# Patient Record
Sex: Female | Born: 1985 | Race: White | Hispanic: No | Marital: Single | Smoking: Former smoker
Health system: Southern US, Community
[De-identification: ages and names within clinical notes are randomized; demographics above are authoritative.]

## PROBLEM LIST (undated history)

## (undated) DIAGNOSIS — J45909 Unspecified asthma, uncomplicated: Secondary | ICD-10-CM

## (undated) DIAGNOSIS — N3010 Interstitial cystitis (chronic) without hematuria: Secondary | ICD-10-CM

## (undated) DIAGNOSIS — G40909 Epilepsy, unspecified, not intractable, without status epilepticus: Secondary | ICD-10-CM

## (undated) HISTORY — PX: EYE SURGERY: SHX253

## (undated) HISTORY — PX: CYSTO: SHX6284

## (undated) HISTORY — PX: HERNIA REPAIR: SHX51

## (undated) HISTORY — PX: IMPLANTATION / PLACEMENT EPIDURAL NEUROSTIMULATOR ELECTRODES: SUR687

---
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
## 2004-03-03 ENCOUNTER — Emergency Department (HOSPITAL_COMMUNITY): Admission: EM | Admit: 2004-03-03 | Discharge: 2004-03-03 | Payer: Self-pay | Admitting: Emergency Medicine

## 2004-07-20 ENCOUNTER — Encounter: Admission: RE | Admit: 2004-07-20 | Discharge: 2004-07-20 | Payer: Self-pay | Admitting: Internal Medicine

## 2004-08-06 ENCOUNTER — Other Ambulatory Visit: Admission: RE | Admit: 2004-08-06 | Discharge: 2004-08-06 | Payer: Self-pay | Admitting: Obstetrics and Gynecology

## 2005-02-26 ENCOUNTER — Emergency Department (HOSPITAL_COMMUNITY): Admission: EM | Admit: 2005-02-26 | Discharge: 2005-02-26 | Payer: Self-pay | Admitting: Emergency Medicine

## 2005-04-24 ENCOUNTER — Emergency Department (HOSPITAL_COMMUNITY): Admission: EM | Admit: 2005-04-24 | Discharge: 2005-04-24 | Payer: Self-pay | Admitting: Emergency Medicine

## 2006-08-02 ENCOUNTER — Emergency Department (HOSPITAL_COMMUNITY): Admission: EM | Admit: 2006-08-02 | Discharge: 2006-08-03 | Payer: Self-pay | Admitting: Emergency Medicine

## 2007-06-02 ENCOUNTER — Encounter: Admission: RE | Admit: 2007-06-02 | Discharge: 2007-07-25 | Payer: Self-pay | Admitting: *Deleted

## 2008-04-06 ENCOUNTER — Emergency Department (HOSPITAL_COMMUNITY): Admission: EM | Admit: 2008-04-06 | Discharge: 2008-04-06 | Payer: Self-pay | Admitting: Emergency Medicine

## 2010-09-11 NOTE — Op Note (Signed)
NAME:  Melanie Dalton, Melanie Dalton                 ACCOUNT NO.:  [id number]   MEDICAL RECORD NO.:  [id number]                   PATIENT TYPE:  AMB   LOCATION:  [facility]                                 FACILITY:  [facility]   PHYSICIAN:  Jamison Neighbor, M.D.               DATE OF BIRTH:  [birth year]   DATE OF PROCEDURE:  04/02/2003  DATE OF DISCHARGE:                                 OPERATIVE REPORT   PREOPERATIVE DIAGNOSIS:  Urgency incontinence.   POSTOPERATIVE DIAGNOSIS:  Urgency incontinence.   OPERATION PERFORMED:  First stage instrument implantation.   SURGEON:  Jamison Neighbor, M.D.   ANESTHESIA:  Intravenous sedation.   COMPLICATIONS:  None.   DRAINS:  Sixteen French Foley catheter removed in the recovery room.   HISTORY:  This 25 year old female has uncontrolled urgency, frequency and  urgency incontinence.  She has not responded to any form of oral therapy and  has failed all forms of anticholinergic treatments.  The patient is to  undergo InterStim testing.  She is aware of the fact this is the first stage  of a planned two-stage approach and that the decision as to whether to  proceed will be based on whether she has a significant decrease in her  urgency, frequency and urge incontinence.  The patient is aware of the fact  this is not being done for pain control, but if she does have improvement in  her pelvic pain that would be considered a nice side effect.  The patient  gave full and informed consent.   DESCRIPTION OF OPERATION:  After successful induction of adequate IV  sedation the patient was placed in the prone position.  She had a Foley  catheter in place.  The buttocks were pulled apart to expose the rectal  sphincter.  The entire area was prepped for 10 minutes with both scrub and  paint.  The patient received preoperative Ancef and gentamicin.  Drapes were  applied including a Steri-Drape.  Fluoroscopy is used to define the midline  and to determine the level of  the third sacral foramen.  Local anesthesia  was infiltrated into the area.  A foramen needle was passed into what was  thought to be the third sacral foramen on the right hand side.  Fluoroscopic  testing indicated this was actually S4 and a new needle was passed one  foramen higher.  Testing on this side showed an excellent Bellow's and also  showed nice dorsiflexion of the great toe.  Testing on the opposite side  demonstrated less of an effect and decision was made to proceed with the  right side.   A small stab wound was made.  The guidewire was then passed down to the  needle, which was removed.  The dilating trocar was passed over the  guidewire, which was then removed.  The quadripolar lead was then passed  down through the dilator, this was pulled back until it was positioned just  underneath the sacral bone.  Testing showed that there an excellent Bellow's  effect and good dorsiflexion, and Grade 2 on leads 1, 2 and 3.  This was  felt to be optimal.  The tines were deployed and fluoroscopy indicated that  the lead was still in the same position.   A small stab incision was then made in the upper aspect of the left buttock.  The guidewire was then tunneled over to that location with a tunneling tool.  The lead extender was then brought from that small incision out to a small  puncture hole in the top of the right buttock.  The lead extender and the  quadripolar lead were then tacked in the usual fashion.  The protective  __________ was tied down with silk sutures.  A small pocket was made and the  connection was passed down into the pocket.  The area was irrigated and  closed with a Vicryl suture as well as two sutures of nylon in a mattress  fashion.  A single mattress stitch was placed in the midline paramedian  incision.   The patient tolerated the procedure well and was taken to the recovery room  in good condition.  The patient will be sent home with Lorcet 10 as well as   Keflex.  The patient will keep a three-day voiding diary before her return  and this will be compared to her voiding diary.                                               Jamison Neighbor, M.D.    RJE/MEDQ  D:  04/02/2003  T:  04/03/2003  Job:  [id number]

## 2010-09-11 NOTE — Op Note (Signed)
NAME:  Melanie Dalton, Melanie Dalton                 ACCOUNT NO.:  [id number]   MEDICAL RECORD NO.:  [id number]                   PATIENT TYPE:  AMB   LOCATION:  [facility]                                 FACILITY:  [facility]   PHYSICIAN:  Jamison Neighbor, M.D.               DATE OF BIRTH:  Jan 17, 1986   DATE OF PROCEDURE:  04/16/2003  DATE OF DISCHARGE:                                 OPERATIVE REPORT   PREOPERATIVE DIAGNOSIS:  Urgency and frequency.   POSTOPERATIVE DIAGNOSIS:  Urgency and frequency.   PROCEDURE:  Second stage InterStim implantation.   SURGEON:  Jamison Neighbor, M.D.   ANESTHESIA:  General.   COMPLICATIONS:  None.   DRAINS:  None.   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  The patient had a first stage  InterStim implant with excellent resolution of her symptoms.  The patient's  battery stopped, and she had prompt return of her frequency and urgency and  when the battery was replaced into the temporary generator, her symptoms  went away.  The patient is now ready to undergo implantation of her  permanent generator.  She understands the risks and benefits of the  procedure including the possibility of infection.  She has been given  preoperative antibiotics and has given informed consent for the procedure.   DESCRIPTION OF PROCEDURE:  After the successful of general anesthesia, the  patient was placed in the supine position, prepped with Betadine, and draped  in the usual sterile fashion.  The small sutures that were present from the  first-stage were removed.  The incision in the left buttocks was then  extended medially.  This was carried then into the subcutaneous tissues  where a small pocket was made.  Hemostasis was obtained with electrocautery.  The connection from the external lead and the previously placed quadripolar  lead was identified.  He connection to the external lead was cut.  The  protective booty was removed and the device disconnected.  The new lead  extender was then attached in the usual fashion.  The booty was tied down  with silk sutures.  The generator was attached to the lead extender.  The  device was placed down within the pocket.  Impedance testing was performed,  and all four leads appeared to function normally.  The incision was  carefully irrigated.  Hemostasis was felt to be adequate.  The incision was  then closed with a running suture of 2-0 Vicryl and a running double  crossing suture of 3-0 nylon.  The patient tolerated the procedure well and  was taken to the recovery room in good condition.  She has pain medications  that were recently called in for her.  She will be sent home on Keflex 3  times a day and will return to the office in follow-up.  Jamison Neighbor, M.D.   RJE/MEDQ  D:  04/16/2003  T:  04/16/2003  Job:  [id number]

## 2010-09-11 NOTE — Op Note (Signed)
NAME:  Melanie Dalton, Melanie Dalton                        ACCOUNT NO.:  [id number]   MEDICAL RECORD NO.:  [id number]                   PATIENT TYPE:  AMB   LOCATION:  [facility]                                 FACILITY:  [facility]   PHYSICIAN:  Jamison Neighbor, M.D.               DATE OF BIRTH:  1985-06-02   DATE OF PROCEDURE:  02/16/2002  DATE OF DISCHARGE:                                 OPERATIVE REPORT   PREOPERATIVE DIAGNOSES:  1. Chronic pelvic pain.  2. Flank pain.  3. Rule out interstitial cystitis.   POSTOPERATIVE DIAGNOSES:  1. Chronic pelvic pain.  2. Flank pain.  3. Rule out interstitial cystitis.   PROCEDURE:  1. Cystoscopy.  2. Urethral calibration.  3. Bilateral retrogrades with interpretation.  4. Hydrodistention of the bladder.  5. Bladder biopsy.  6. Marcaine and Pyridium instillation.  7. Marcaine and Kenalog injection.   SURGEON:  Jamison Neighbor, M.D.   ANESTHESIA:  General.   COMPLICATIONS:  None.   DRAINS:  None.   BRIEF HISTORY:  This 25 year old female has had chronic pelvic pain.  The  patient is also known to have had fibromyalgia and has had chronic pelvic  pain for most of her life.  The patient is thought to the high likely to  have interstitial cystitis.  She is now to undergo diagnostic examination.  Because of a recent episode of hematuria, she will also undergo upper tract  evaluation.  She gave full and informed consent.   DESCRIPTION OF PROCEDURE:  After the successful induction of general  anesthesia, the patient was placed in the dorsal lithotomy position, prepped  with Betadine, and draped in the usual sterile fashion.  Careful bimanual  examination revealed no abnormalities of the urethra.  Specifically, no  evidence of  a urethral diverticulum or other suburethral mass.  There was  no cystocele, rectocele, or enterocele.  The uterus was palpably normal.  There were no adnexal masses.  The urethra was normal caliber, accepting a  61 [place]  female urethral sound with no evidence of stenosis or stricture.  The cystoscope was inserted, and the bladder was carefully inspected.  It  was free of any tumor or stones.  Both ureteral orifices were normal in  configuration and location.  Retrograde studies were performed bilaterally.  The collecting system appeared normal.  Drain out films were unremarkable.  There was no evidence of any filling defect or any evidence of bleeding at  this time.  The bladder was then distended at a pressure of 100 cm of water  for five minutes.  When the bladder was drained, modest glomerulations could  be seen.  Bladder capacity was diminished at  800 cc with normal being 1150  cc at 100 cm pressure.  The patient had a bladder biopsy which will be sent  for mast cell analysis.  The biopsy site was cauterized; the bladder was  drained.  A mixture of Marcaine and Pyridium was left within the bladder.  Marcaine and Kenalog were injected periurethrally.  The patient had a  vaginal pack applied.  She was given a preoperative B&O suppository as well  as intraoperative Toradol and Zofran.  The patient tolerated the procedure  well and was taken to the recovery room in good condition.  She will be sent  home with Lorcet Plus and Levaquin.  She already has Pyridium Plus as well  as her regular medications.  She will return to see me in two weeks' time  for follow-up.                                               Jamison Neighbor, M.D.    RJE/MEDQ  D:  02/16/2002  T:  02/16/2002  Job:  [id number]   cc:   Amedeo Kinsman, M.D.

## 2010-10-30 ENCOUNTER — Emergency Department (HOSPITAL_COMMUNITY)
Admission: EM | Admit: 2010-10-30 | Discharge: 2010-10-30 | Disposition: A | Discharge status: Home / Self Care | Payer: Medicaid - Out of State | Attending: Emergency Medicine | Admitting: Emergency Medicine

## 2010-10-30 DIAGNOSIS — E538 Deficiency of other specified B group vitamins: Secondary | ICD-10-CM | POA: Insufficient documentation

## 2010-10-30 DIAGNOSIS — Z79899 Other long term (current) drug therapy: Secondary | ICD-10-CM | POA: Insufficient documentation

## 2010-10-30 DIAGNOSIS — F17200 Nicotine dependence, unspecified, uncomplicated: Secondary | ICD-10-CM | POA: Insufficient documentation

## 2010-10-30 DIAGNOSIS — Z9889 Other specified postprocedural states: Secondary | ICD-10-CM | POA: Insufficient documentation

## 2010-10-30 DIAGNOSIS — R51 Headache: Secondary | ICD-10-CM | POA: Insufficient documentation

## 2010-10-30 LAB — URINALYSIS, ROUTINE W REFLEX MICROSCOPIC
Bilirubin Urine: NEGATIVE
Glucose, UA: NEGATIVE mg/dL
Hgb urine dipstick: NEGATIVE
Ketones, ur: NEGATIVE mg/dL
Leukocytes, UA: NEGATIVE
Nitrite: NEGATIVE
Protein, ur: NEGATIVE mg/dL
Specific Gravity, Urine: <1.005 — ABNORMAL LOW (ref 1.005–1.030)
Urobilinogen, UA: 0.2 mg/dL (ref 0.0–1.0)
pH: 7 (ref 5.0–8.0)

## 2010-10-30 LAB — PREGNANCY, URINE: Preg Test, Ur: NEGATIVE

## 2013-02-07 ENCOUNTER — Emergency Department (HOSPITAL_COMMUNITY)
Admission: EM | Admit: 2013-02-07 | Discharge: 2013-02-08 | Disposition: A | Discharge status: Home / Self Care | Payer: Medicaid - Out of State | Attending: Emergency Medicine | Admitting: Emergency Medicine

## 2013-02-07 ENCOUNTER — Encounter (HOSPITAL_COMMUNITY): Payer: Self-pay | Admitting: Emergency Medicine

## 2013-02-07 DIAGNOSIS — K089 Disorder of teeth and supporting structures, unspecified: Secondary | ICD-10-CM | POA: Insufficient documentation

## 2013-02-07 DIAGNOSIS — J45901 Unspecified asthma with (acute) exacerbation: Secondary | ICD-10-CM | POA: Insufficient documentation

## 2013-02-07 DIAGNOSIS — K0889 Other specified disorders of teeth and supporting structures: Secondary | ICD-10-CM

## 2013-02-07 DIAGNOSIS — Z79899 Other long term (current) drug therapy: Secondary | ICD-10-CM | POA: Insufficient documentation

## 2013-02-07 DIAGNOSIS — F17200 Nicotine dependence, unspecified, uncomplicated: Secondary | ICD-10-CM | POA: Insufficient documentation

## 2013-02-07 DIAGNOSIS — J40 Bronchitis, not specified as acute or chronic: Secondary | ICD-10-CM

## 2013-02-07 DIAGNOSIS — Z87448 Personal history of other diseases of urinary system: Secondary | ICD-10-CM | POA: Insufficient documentation

## 2013-02-07 DIAGNOSIS — G40909 Epilepsy, unspecified, not intractable, without status epilepticus: Secondary | ICD-10-CM | POA: Insufficient documentation

## 2013-02-07 DIAGNOSIS — J3489 Other specified disorders of nose and nasal sinuses: Secondary | ICD-10-CM | POA: Insufficient documentation

## 2013-02-07 HISTORY — DX: Interstitial cystitis (chronic) without hematuria: N30.10

## 2013-02-07 HISTORY — DX: Epilepsy, unspecified, not intractable, without status epilepticus: G40.909

## 2013-02-07 HISTORY — DX: Unspecified asthma, uncomplicated: J45.909

## 2013-02-07 NOTE — ED Notes (Signed)
Pt c/o dental pain after having temporary bridge placed last week.

## 2013-02-08 MED ORDER — ALBUTEROL SULFATE (5 MG/ML) 0.5% IN NEBU
2.5000 mg | INHALATION_SOLUTION | Freq: Once | RESPIRATORY_TRACT | Status: AC
  Administered 2013-02-08: 2.5 mg via RESPIRATORY_TRACT
  Filled 2013-02-08: qty 0.5

## 2013-02-08 MED ORDER — TRAMADOL HCL 50 MG PO TABS: ORAL_TABLET | ORAL | Status: AC

## 2013-02-08 MED ORDER — PREDNISONE 50 MG PO TABS
60.0000 mg | ORAL_TABLET | Freq: Once | ORAL | Status: AC
  Administered 2013-02-08: 60 mg via ORAL
  Filled 2013-02-08 (×2): qty 1

## 2013-02-08 MED ORDER — IPRATROPIUM BROMIDE 0.02 % IN SOLN
0.5000 mg | Freq: Once | RESPIRATORY_TRACT | Status: AC
  Administered 2013-02-08: 0.5 mg via RESPIRATORY_TRACT
  Filled 2013-02-08: qty 2.5

## 2013-02-08 MED ORDER — PREDNISONE 10 MG PO TABS: ORAL_TABLET | ORAL | Status: DC

## 2013-02-08 MED ORDER — KETOROLAC TROMETHAMINE 10 MG PO TABS
10.0000 mg | ORAL_TABLET | Freq: Once | ORAL | Status: AC
  Administered 2013-02-08: 10 mg via ORAL
  Filled 2013-02-08: qty 1

## 2013-02-08 MED ORDER — DOXYCYCLINE HYCLATE 100 MG PO TABS
100.0000 mg | ORAL_TABLET | Freq: Once | ORAL | Status: AC
  Administered 2013-02-08: 100 mg via ORAL
  Filled 2013-02-08: qty 1

## 2013-02-08 MED ORDER — ONDANSETRON HCL 4 MG PO TABS
4.0000 mg | ORAL_TABLET | Freq: Once | ORAL | Status: AC
  Administered 2013-02-08: 4 mg via ORAL
  Filled 2013-02-08: qty 1

## 2013-02-08 MED ORDER — DOXYCYCLINE HYCLATE 100 MG PO CAPS: 100.0000 mg | ORAL_CAPSULE | Freq: Two times a day (BID) | ORAL | Status: AC

## 2013-02-08 NOTE — ED Provider Notes (Signed)
Medical screening examination/treatment/procedure(s) were performed by non-physician practitioner and as supervising physician I was immediately available for consultation/collaboration.  Geoffery Lyons, MD 02/08/13 [phone]

## 2013-02-08 NOTE — ED Provider Notes (Signed)
CSN: [id number]     Arrival date & time 02/07/13  2347 History   First MD Initiated Contact with Patient 02/08/13 0003     Chief Complaint  Patient presents with  . Dental Pain   (Consider location/radiation/quality/duration/timing/severity/associated sxs/prior Treatment) HPI Comments: Pt is from [place]. She a dental procedure done about 1 week ago with a temp. Bridge procedure. She now has pain and soreness at the procedure site. She is concerned for possible infection.  Pt also reports cough, wheezing and congestion. She has a hx of asthma and is a smoker. She states she has felt tired today with shopping and having frequent cough. She denies being out of the country recently. She does not know of being around anyone who has recently traveled to other countries. No reported high fevers. No n/v/d. She has tried a natural anti-inflammatory without success.  Patient is a 27 y.o. female presenting with tooth pain. The history is provided by the patient.  Dental Pain Severity:  Moderate Onset quality:  Gradual Duration:  1 week Timing:  Intermittent Progression:  Worsening Chronicity:  Chronic Context comment:  Dental pain following dental bridge procedure last week Relieved by:  Nothing Worsened by:  Nothing tried Ineffective treatments:  None tried Associated symptoms: no difficulty swallowing, no drooling and no neck pain   Risk factors: smoking     Past Medical History  Diagnosis Date  . Epilepsy   . Interstitial cystitis   . Asthma    Past Surgical History  Procedure Laterality Date  . Hernia repair    . Implantation / placement epidural neurostimulator electrodes    . Eye surgery    . Cysto     History reviewed. No pertinent family history. History  Substance Use Topics  . Smoking status: Current Every Day Smoker    Types: Cigarettes  . Smokeless tobacco: Not on file  . Alcohol Use: No   OB History   Grav Para Term Preterm Abortions TAB SAB Ect Mult Living            Review of Systems  Constitutional: Negative for activity change.       All ROS Neg except as noted in HPI  HENT: Negative for drooling and nosebleeds.   Eyes: Negative for photophobia and discharge.  Respiratory: Positive for wheezing. Negative for cough and shortness of breath.   Cardiovascular: Negative for chest pain and palpitations.  Gastrointestinal: Negative for abdominal pain and blood in stool.  Genitourinary: Negative for dysuria, frequency and hematuria.  Musculoskeletal: Negative for arthralgias, back pain and neck pain.  Skin: Negative.   Neurological: Positive for seizures. Negative for dizziness and speech difficulty.  Psychiatric/Behavioral: Negative for hallucinations and confusion.    Allergies  Beef-derived products; Cymbalta; Effexor; Keflex; Phenergan; Pork-derived products; Reglan; Tylenol; and Vicodin  Home Medications   Current Outpatient Rx  Name  Route  Sig  Dispense  Refill  . cyanocobalamin 100 MCG tablet   Oral   Take 100 mcg by mouth daily.         Marland Kitchen levETIRAcetam (KEPPRA) 1000 MG tablet   Oral   Take 1,000 mg by mouth 2 (two) times daily.          BP 118/93  Pulse 93  Temp(Src) 98.2 F (36.8 C) (Oral)  Resp 20  Ht 5\' 3"  (1.6 m)  Wt 155 lb (70.308 kg)  BMI 27.46 kg/m2  SpO2 98%  LMP 01/19/2013 Physical Exam  Nursing note and vitals reviewed. Constitutional: She is  oriented to person, place, and time. She appears well-developed and well-nourished.  Non-toxic appearance.  HENT:  Head: Normocephalic.  Right Ear: Tympanic membrane and external ear normal.  Left Ear: Tympanic membrane and external ear normal.  No significant swelling or abscess near the temporary bridge of the front teeth.  Eyes: EOM and lids are normal. Pupils are equal, round, and reactive to light.  Neck: Normal range of motion. Neck supple. Carotid bruit is not present.  Cardiovascular: Normal rate, regular rhythm, normal heart sounds, intact distal  pulses and normal pulses.   Pulmonary/Chest: No respiratory distress. She has wheezes. She has rhonchi.  Abdominal: Soft. Bowel sounds are normal. There is no tenderness. There is no guarding.  Musculoskeletal: Normal range of motion.  Lymphadenopathy:       Head (right side): No submandibular adenopathy present.       Head (left side): No submandibular adenopathy present.    She has no cervical adenopathy.  Neurological: She is alert and oriented to person, place, and time. She has normal strength. No cranial nerve deficit or sensory deficit.  Skin: Skin is warm and dry.  Psychiatric: She has a normal mood and affect. Her speech is normal.    ED Course  Procedures (including critical care time) Labs Review Labs Reviewed - No data to display Imaging Review No results found.  EKG Interpretation   None       MDM  No diagnosis found. **I have reviewed nursing notes, vital signs, and all appropriate lab and imaging results for this patient.*  Vital signs stable.  PUlse ox 98% on room air. WNL by my interpretation. No visible abscess note on oral exam. Plan -  Rx for doxycycline, prednisone and tramadol. Pt has albuterol inhaler. Encourage pt to stop smoking.  Kathie Dike, PA-C 02/08/13 [phone]

## 2013-08-20 ENCOUNTER — Emergency Department (HOSPITAL_COMMUNITY)
Admission: EM | Admit: 2013-08-20 | Discharge: 2013-08-20 | Disposition: A | Discharge status: Home / Self Care | Payer: Medicaid - Out of State | Attending: Emergency Medicine | Admitting: Emergency Medicine

## 2013-08-20 ENCOUNTER — Encounter (HOSPITAL_COMMUNITY): Payer: Self-pay | Admitting: Emergency Medicine

## 2013-08-20 ENCOUNTER — Emergency Department (HOSPITAL_COMMUNITY): Payer: Medicaid - Out of State

## 2013-08-20 DIAGNOSIS — E876 Hypokalemia: Secondary | ICD-10-CM | POA: Insufficient documentation

## 2013-08-20 DIAGNOSIS — R5381 Other malaise: Secondary | ICD-10-CM | POA: Insufficient documentation

## 2013-08-20 DIAGNOSIS — F17200 Nicotine dependence, unspecified, uncomplicated: Secondary | ICD-10-CM | POA: Insufficient documentation

## 2013-08-20 DIAGNOSIS — R21 Rash and other nonspecific skin eruption: Secondary | ICD-10-CM | POA: Insufficient documentation

## 2013-08-20 DIAGNOSIS — Z8742 Personal history of other diseases of the female genital tract: Secondary | ICD-10-CM | POA: Insufficient documentation

## 2013-08-20 DIAGNOSIS — R5383 Other fatigue: Secondary | ICD-10-CM

## 2013-08-20 DIAGNOSIS — Z79899 Other long term (current) drug therapy: Secondary | ICD-10-CM | POA: Insufficient documentation

## 2013-08-20 DIAGNOSIS — J45909 Unspecified asthma, uncomplicated: Secondary | ICD-10-CM | POA: Insufficient documentation

## 2013-08-20 DIAGNOSIS — G40909 Epilepsy, unspecified, not intractable, without status epilepticus: Secondary | ICD-10-CM | POA: Insufficient documentation

## 2013-08-20 DIAGNOSIS — Z3202 Encounter for pregnancy test, result negative: Secondary | ICD-10-CM | POA: Insufficient documentation

## 2013-08-20 DIAGNOSIS — IMO0002 Reserved for concepts with insufficient information to code with codable children: Secondary | ICD-10-CM | POA: Insufficient documentation

## 2013-08-20 LAB — URINALYSIS, ROUTINE W REFLEX MICROSCOPIC
Bilirubin Urine: NEGATIVE
Glucose, UA: NEGATIVE mg/dL
KETONES UR: NEGATIVE mg/dL
LEUKOCYTES UA: NEGATIVE
NITRITE: NEGATIVE
PROTEIN: NEGATIVE mg/dL
Specific Gravity, Urine: <1.005 — ABNORMAL LOW (ref 1.005–1.030)
Urobilinogen, UA: 0.2 mg/dL (ref 0.0–1.0)
pH: 6 (ref 5.0–8.0)

## 2013-08-20 LAB — CBC WITH DIFFERENTIAL/PLATELET
Basophils Absolute: 0 10*3/uL (ref 0.0–0.1)
Basophils Relative: 0 % (ref 0–1)
Eosinophils Absolute: 0.1 10*3/uL (ref 0.0–0.7)
Eosinophils Relative: 1 % (ref 0–5)
HEMATOCRIT: 40.8 % (ref 36.0–46.0)
Hemoglobin: 14.6 g/dL (ref 12.0–15.0)
LYMPHS ABS: 1.7 10*3/uL (ref 0.7–4.0)
Lymphocytes Relative: 24 % (ref 12–46)
MCH: 31.1 pg (ref 26.0–34.0)
MCHC: 35.8 g/dL (ref 30.0–36.0)
MCV: 87 fL (ref 78.0–100.0)
MONO ABS: 0.3 10*3/uL (ref 0.1–1.0)
Monocytes Relative: 5 % (ref 3–12)
NEUTROS ABS: 5 10*3/uL (ref 1.7–7.7)
NEUTROS PCT: 70 % (ref 43–77)
Platelets: 258 10*3/uL (ref 150–400)
RBC: 4.69 MIL/uL (ref 3.87–5.11)
RDW: 12.6 % (ref 11.5–15.5)
WBC: 7.1 10*3/uL (ref 4.0–10.5)

## 2013-08-20 LAB — URINE MICROSCOPIC-ADD ON

## 2013-08-20 LAB — COMPREHENSIVE METABOLIC PANEL
ALK PHOS: 64 U/L (ref 39–117)
ALT: 11 U/L (ref 0–35)
AST: 14 U/L (ref 0–37)
Albumin: 4.3 g/dL (ref 3.5–5.2)
BILIRUBIN TOTAL: 0.5 mg/dL (ref 0.3–1.2)
BUN: 9 mg/dL (ref 6–23)
CHLORIDE: 100 meq/L (ref 96–112)
CO2: 20 meq/L (ref 19–32)
CREATININE: 0.72 mg/dL (ref 0.50–1.10)
Calcium: 9.9 mg/dL (ref 8.4–10.5)
GLUCOSE: 159 mg/dL — AB (ref 70–99)
POTASSIUM: 3 meq/L — AB (ref 3.7–5.3)
Sodium: 136 mEq/L — ABNORMAL LOW (ref 137–147)
Total Protein: 7.7 g/dL (ref 6.0–8.3)

## 2013-08-20 LAB — PHENYTOIN LEVEL, TOTAL: Phenytoin Lvl: <2.5 ug/mL — ABNORMAL LOW (ref 10.0–20.0)

## 2013-08-20 LAB — CK: Total CK: 69 U/L (ref 7–177)

## 2013-08-20 LAB — TSH: TSH: 1.42 u[IU]/mL (ref 0.350–4.500)

## 2013-08-20 LAB — SEDIMENTATION RATE: Sed Rate: 7 mm/hr (ref 0–22)

## 2013-08-20 LAB — PREGNANCY, URINE: Preg Test, Ur: NEGATIVE

## 2013-08-20 MED ORDER — POTASSIUM CHLORIDE ER 10 MEQ PO TBCR: 10.0000 meq | EXTENDED_RELEASE_TABLET | Freq: Every day | ORAL | Status: AC

## 2013-08-20 MED ORDER — PREDNISONE 20 MG PO TABS: 40.0000 mg | ORAL_TABLET | Freq: Every day | ORAL | Status: DC

## 2013-08-20 MED ORDER — METHYLPREDNISOLONE SODIUM SUCC 125 MG IJ SOLR
125.0000 mg | Freq: Once | INTRAMUSCULAR | Status: AC
  Administered 2013-08-20: 125 mg via INTRAVENOUS
  Filled 2013-08-20: qty 2

## 2013-08-20 MED ORDER — SODIUM CHLORIDE 0.9 % IV BOLUS (SEPSIS)
1000.0000 mL | Freq: Once | INTRAVENOUS | Status: AC
  Administered 2013-08-20: 1000 mL via INTRAVENOUS

## 2013-08-20 MED ORDER — PHENYTOIN SODIUM EXTENDED 100 MG PO CAPS
300.0000 mg | ORAL_CAPSULE | Freq: Once | ORAL | Status: AC
  Administered 2013-08-20: 300 mg via ORAL
  Filled 2013-08-20: qty 3

## 2013-08-20 MED ORDER — POTASSIUM CHLORIDE CRYS ER 20 MEQ PO TBCR
20.0000 meq | EXTENDED_RELEASE_TABLET | Freq: Once | ORAL | Status: AC
  Administered 2013-08-20: 20 meq via ORAL
  Filled 2013-08-20: qty 1

## 2013-08-20 NOTE — ED Notes (Signed)
Pt reports she drove herself here and that her family is unable to take her home as they are at work. Pt states she will drive herself back home and "I will pull over and rest if I need to".

## 2013-08-20 NOTE — Discharge Instructions (Signed)
Your Thyroid test has been retested - this will not be available today - have your doctor obtain these results to further discuss your work up.  Take potassium supplement for the next 5 days as well as prednisone.  [facility]    Kari BaarsEdward Hawkins MD. Specialty: Pulmonary Disease Contact information: [address]  [address]  [place] [address]  [phone]   Syliva OvermanMargaret Simpson, MD. Specialty: [facility] Contact information: [address]  [place] [address]  [phone]   Lilyan PuntScott Luking, MD. Specialty: [facility] Contact information: [address]  [address]  [place] [address]  [phone]   Avon Gullyesfaye Fanta, MD Specialty: Internal Medicine Contact information: [address]  [place] [address]  [phone]   Catalina PizzaZach Hall, MD. Specialty: Internal Medicine Contact information: [address]  [place] [address]  [phone]   Butch PennyAngus Mcinnis, MD. Specialty: Family Medicine Contact information: [address]  [place] [address]  [phone]   John GiovanniStephen Knowlton, MD. Specialty: [facility] Contact information: [address]  [address]  [place] [address]  [phone]   Carylon Perchesoy Fagan, MD. Specialty: Internal Medicine Contact information: [address]  [address]  [place] [address]  [phone]

## 2013-08-20 NOTE — ED Notes (Signed)
Patient c/o feeling weak, lethargic and loss of appetite.  Patient states she hasn't eaten since Saturday.  Patient states she has been getting rashes and swelling on face, hands and chest.

## 2013-08-20 NOTE — ED Provider Notes (Signed)
CSN: [id number]     Arrival date & time 08/20/13  0123 History   First MD Initiated Contact with Patient 08/20/13 0144     Chief Complaint  Patient presents with  . Weakness     (Consider location/radiation/quality/duration/timing/severity/associated sxs/prior Treatment) HPI Comments: The pt is a 28 y/o female with hx of interstitial cystitis, states she was dx with fibromyalgia at the age of 28 and who has had a nerve stimulator implanted in her Right Lower Back for her sacral nerves.  She has family members with Lupus and RA and states that in the last 2 months she was tested for thyroid condition b/c she was having some rapid weight gain, low energy and hair loss.  She reports that these tests were negative.  Today she presents with generalized weakness and a rash.  This has been ongoing for the last couple of weeks and waxes and wanes.  The weakness is a fatigue, it is generalized and has become so bad over the last couple of days that she has a hard time getting out of bed.  She is staying with her parents here but [place]  The rash that is associated with this has been located across the upper chest, bilateral cheeks and nose as well as on the hands which will often swell (both periorbital and bialteral hands) when the rash is present.  She states that the rash has temporarily eased off.  She has no pruritis with the rash when it comes on.  She denies fevers, chills, nausea or vomiting - she has had nothing to eat or drink in the last 36 hours and feels dehdyrated.  No diarrhea, no dysuria, no cough.  She also reports a hx of seizure d/c and has been changed to dilantin in the last year - she has been out of her medicine for 2 days.    Patient is a 28 y.o. female presenting with weakness. The history is provided by the patient and medical records.  Weakness    Past Medical History  Diagnosis Date  . Epilepsy   . Interstitial cystitis   . Asthma    Past Surgical History   Procedure Laterality Date  . Hernia repair    . Implantation / placement epidural neurostimulator electrodes    . Eye surgery    . Cysto     No family history on file. History  Substance Use Topics  . Smoking status: Current Every Day Smoker    Types: Cigarettes  . Smokeless tobacco: Not on file  . Alcohol Use: No   OB History   Grav Para Term Preterm Abortions TAB SAB Ect Mult Living                 Review of Systems  Neurological: Positive for weakness.  All other systems reviewed and are negative.     Allergies  Beef-derived products; Cymbalta; Effexor; Keflex; Phenergan; Pork-derived products; Reglan; Tylenol; and Vicodin  Home Medications   Prior to Admission medications   Medication Sig Start Date End Date Taking? Authorizing Provider  cyanocobalamin 100 MCG tablet Take 100 mcg by mouth daily.    Historical Provider, MD  levETIRAcetam (KEPPRA) 1000 MG tablet Take 1,000 mg by mouth 2 (two) times daily.    Historical Provider, MD  predniSONE (DELTASONE) 10 MG tablet 5,4,3,2,1 - take with food 02/08/13   Kathie DikeHobson M Bryant, PA-C  traMADol (ULTRAM) 50 MG tablet 1 or 2 po q6h prn pain 02/08/13  Kathie DikeHobson M Bryant, PA-C   BP 108/72  Pulse 89  Temp(Src) 98 F (36.7 C) (Oral)  Resp 24  Ht 5\' 3"  (1.6 m)  Wt 165 lb (74.844 kg)  BMI 29.24 kg/m2  SpO2 97%  LMP 07/30/2013 Physical Exam  Nursing note and vitals reviewed. Constitutional: She appears well-developed and well-nourished. No distress.  HENT:  Head: Normocephalic and atraumatic.  Mouth/Throat: No oropharyngeal exudate.  MM dehdyrated, no masses, no exudate, no hypertrophy or erythema.  No facial rash at this time  Eyes: Conjunctivae and EOM are normal. Pupils are equal, round, and reactive to light. Right eye exhibits no discharge. Left eye exhibits no discharge. No scleral icterus.  No periorbital swelling, conj clear  Neck: Normal range of motion. Neck supple. No JVD present. No thyromegaly present.  No  cervical LAD  Cardiovascular: Regular rhythm, normal heart sounds and intact distal pulses.  Exam reveals no gallop and no friction rub.   No murmur heard. Tachycardic, normal heart sounds otherwise, no JVD, edema.  Normal pulses at radial arteries.  Pulmonary/Chest: Effort normal and breath sounds normal. No respiratory distress. She has no wheezes. She has no rales.  Abdominal: Soft. Bowel sounds are normal. She exhibits no distension and no mass. There is no tenderness.  Musculoskeletal: Normal range of motion. She exhibits no edema and no tenderness.  Lymphadenopathy:    She has no cervical adenopathy.  Neurological: She is alert. Coordination normal.  Speech is clear, movements are coordinated and precise and strength is equal in all 4 extremities though there is generalized fatigue.  The pt is able to ambulate without difficulty from triage to her treatment room.  Skin: Skin is warm and dry.  Slight erythematous maculopapular rash to the upper chest (clearing)  Psychiatric: She has a normal mood and affect. Her behavior is normal.    ED Course  Procedures (including critical care time) Labs Review Labs Reviewed  COMPREHENSIVE METABOLIC PANEL - Abnormal; Notable for the following:    Sodium 136 (*)    Potassium 3.0 (*)    Glucose, Bld 159 (*)    All other components within normal limits  URINALYSIS, ROUTINE W REFLEX MICROSCOPIC - Abnormal; Notable for the following:    Specific Gravity, Urine <1.005 (*)    Hgb urine dipstick TRACE (*)    All other components within normal limits  PHENYTOIN LEVEL, TOTAL - Abnormal; Notable for the following:    Phenytoin Lvl <2.5 (*)    All other components within normal limits  URINE MICROSCOPIC-ADD ON - Abnormal; Notable for the following:    Squamous Epithelial / LPF FEW (*)    All other components within normal limits  CK  SEDIMENTATION RATE  CBC WITH DIFFERENTIAL  PREGNANCY, URINE  TSH    Imaging Review Dg Chest 2 View  08/20/2013    CLINICAL DATA:  Weakness and lethargy. Loss of appetite. Chest swelling. History of asthma.  EXAM: CHEST  2 VIEW  COMPARISON:  Chest radiograph performed 04/06/2008  FINDINGS: The lungs are well-aerated and clear. There is no evidence of focal opacification, pleural effusion or pneumothorax.  The heart is normal in size; the mediastinal contour is within normal limits. No acute osseous abnormalities are seen.  IMPRESSION: No acute cardiopulmonary process seen.   Electronically Signed   By: Roanna RaiderJeffery  Chang M.D.   On: 08/20/2013 03:54      MDM   Final diagnoses:  Fatigue  Hypokalemia    The pt does have pictures of her  rash and swelling over the last 2 weeks - some days the rash appears to be a butterfly distribution malar rash, no petechiae / purpura and no vesicles / pustules or scabbing seen.  I suspect that she has an autoimmune / rheumatological d/o and would consider lupus, polymyalgia, dermatomyositis.  Will given solumedrol, fluids and evaluate for other sources of weakness / fatigue including anemia, UA and repeat thyroid testing, CK and Sed Rate.  Pt in agreement with plan.  She denies pain or nausea.  Labs unremarkable including sedimentation rate, CK, blood counts and metabolic panel except for mild hypokalemia. This was replaced in the emergency department. Urinalysis shows no signs of infection, hematuria or significant dehydration.   Patient informed of results and will followup as outpatient.  Meds given in ED:  Medications  sodium chloride 0.9 % bolus 1,000 mL (0 mLs Intravenous Stopped 08/20/13 0320)  methylPREDNISolone sodium succinate (SOLU-MEDROL) 125 mg/2 mL injection 125 mg (125 mg Intravenous Given 08/20/13 0202)  potassium chloride SA (K-DUR,KLOR-CON) CR tablet 20 mEq (20 mEq Oral Given 08/20/13 0237)  phenytoin (DILANTIN) ER capsule 300 mg (300 mg Oral Given 08/20/13 0237)    New Prescriptions   POTASSIUM CHLORIDE (K-DUR) 10 MEQ TABLET    Take 1 tablet (10 mEq total)  by mouth daily.   PREDNISONE (DELTASONE) 20 MG TABLET    Take 2 tablets (40 mg total) by mouth daily.      Vida Roller, MD 08/20/13 [phone]

## 2014-03-10 ENCOUNTER — Encounter (HOSPITAL_COMMUNITY): Payer: Self-pay | Admitting: Emergency Medicine

## 2014-03-10 ENCOUNTER — Emergency Department (HOSPITAL_COMMUNITY)
Admission: EM | Admit: 2014-03-10 | Discharge: 2014-03-10 | Disposition: A | Discharge status: Home / Self Care | Payer: Medicaid - Out of State | Attending: Emergency Medicine | Admitting: Emergency Medicine

## 2014-03-10 DIAGNOSIS — Z7952 Long term (current) use of systemic steroids: Secondary | ICD-10-CM | POA: Insufficient documentation

## 2014-03-10 DIAGNOSIS — L02811 Cutaneous abscess of head [any part, except face]: Secondary | ICD-10-CM | POA: Diagnosis present

## 2014-03-10 DIAGNOSIS — Z79899 Other long term (current) drug therapy: Secondary | ICD-10-CM | POA: Diagnosis not present

## 2014-03-10 DIAGNOSIS — J45909 Unspecified asthma, uncomplicated: Secondary | ICD-10-CM | POA: Insufficient documentation

## 2014-03-10 DIAGNOSIS — Z87891 Personal history of nicotine dependence: Secondary | ICD-10-CM | POA: Insufficient documentation

## 2014-03-10 DIAGNOSIS — R000 Tachycardia, unspecified: Secondary | ICD-10-CM | POA: Insufficient documentation

## 2014-03-10 DIAGNOSIS — Z87448 Personal history of other diseases of urinary system: Secondary | ICD-10-CM | POA: Insufficient documentation

## 2014-03-10 DIAGNOSIS — G40909 Epilepsy, unspecified, not intractable, without status epilepticus: Secondary | ICD-10-CM | POA: Diagnosis not present

## 2014-03-10 MED ORDER — OXYCODONE HCL 5 MG PO TABS: 5.0000 mg | ORAL_TABLET | Freq: Four times a day (QID) | ORAL | Status: DC | PRN

## 2014-03-10 MED ORDER — ONDANSETRON HCL 4 MG PO TABS
4.0000 mg | ORAL_TABLET | Freq: Once | ORAL | Status: DC
  Filled 2014-03-10: qty 1

## 2014-03-10 MED ORDER — DICLOFENAC SODIUM 75 MG PO TBEC: 75.0000 mg | DELAYED_RELEASE_TABLET | Freq: Two times a day (BID) | ORAL | Status: AC

## 2014-03-10 MED ORDER — DOXYCYCLINE HYCLATE 100 MG PO TABS
100.0000 mg | ORAL_TABLET | Freq: Once | ORAL | Status: AC
  Administered 2014-03-10: 100 mg via ORAL
  Filled 2014-03-10: qty 1

## 2014-03-10 MED ORDER — OXYCODONE HCL 5 MG PO TABS
5.0000 mg | ORAL_TABLET | Freq: Once | ORAL | Status: AC
  Administered 2014-03-10: 5 mg via ORAL
  Filled 2014-03-10: qty 1

## 2014-03-10 MED ORDER — KETOROLAC TROMETHAMINE 10 MG PO TABS
10.0000 mg | ORAL_TABLET | Freq: Once | ORAL | Status: AC
  Administered 2014-03-10: 10 mg via ORAL
  Filled 2014-03-10: qty 1

## 2014-03-10 MED ORDER — DOXYCYCLINE HYCLATE 100 MG PO CAPS: 100.0000 mg | ORAL_CAPSULE | Freq: Two times a day (BID) | ORAL | Status: AC

## 2014-03-10 NOTE — ED Notes (Signed)
Patient reports some friends noticed she was bleeding from the back of her head. Patient denies any known injury or hitting head. Small amount of dried blood noted in hair. Patient complains of pain to area.

## 2014-03-10 NOTE — ED Provider Notes (Signed)
CSN: [id number]     Arrival date & time 03/10/14  0103 History   First MD Initiated Contact with Patient 03/10/14 0122     Chief Complaint  Patient presents with  . Head Injury     (Consider location/radiation/quality/duration/timing/severity/associated sxs/prior Treatment) Patient is a 28 y.o. female presenting with abscess.  Abscess Location:  Head/neck Head/neck abscess location:  Scalp Abscess quality: draining   Red streaking: no   Duration:  1 day Progression:  Worsening Chronicity:  New Context: not diabetes and not immunosuppression   Relieved by:  Nothing Worsened by:  Nothing tried Ineffective treatments:  None tried Associated symptoms: no fever, no nausea and no vomiting   Risk factors: no hx of MRSA     Past Medical History  Diagnosis Date  . Epilepsy   . Interstitial cystitis   . Asthma    Past Surgical History  Procedure Laterality Date  . Hernia repair    . Implantation / placement epidural neurostimulator electrodes    . Eye surgery    . Cysto     History reviewed. No pertinent family history. History  Substance Use Topics  . Smoking status: Former Smoker    Types: Cigarettes    Quit date: 06/10/2013  . Smokeless tobacco: Not on file  . Alcohol Use: No   OB History    No data available     Review of Systems  Constitutional: Negative for fever, activity change and appetite change.       All ROS Neg except as noted in HPI  HENT: Negative for nosebleeds.   Eyes: Negative for photophobia and discharge.  Respiratory: Negative for cough, shortness of breath and wheezing.   Cardiovascular: Negative for chest pain and palpitations.  Gastrointestinal: Negative for nausea, vomiting, abdominal pain and blood in stool.  Genitourinary: Negative for dysuria, frequency and hematuria.  Musculoskeletal: Negative for back pain, arthralgias and neck pain.  Skin: Negative.   Neurological: Positive for seizures. Negative for dizziness and speech  difficulty.  Psychiatric/Behavioral: Negative for hallucinations and confusion.      Allergies  Beef-derived products; Cymbalta; Effexor; Keflex; Phenergan; Pork-derived products; Reglan; Tylenol; and Vicodin  Home Medications   Prior to Admission medications   Medication Sig Start Date End Date Taking? Authorizing Provider  cyanocobalamin 100 MCG tablet Take 100 mcg by mouth daily.    Historical Provider, MD  levETIRAcetam (KEPPRA) 1000 MG tablet Take 1,000 mg by mouth 2 (two) times daily.    Historical Provider, MD  potassium chloride (K-DUR) 10 MEQ tablet Take 1 tablet (10 mEq total) by mouth daily. 08/20/13   Vida RollerBrian D Miller, MD  predniSONE (DELTASONE) 10 MG tablet 6,5,7,8,45,4,3,2,1 - take with food 02/08/13   Kathie DikeHobson M Emilly Lavey, PA-C  predniSONE (DELTASONE) 20 MG tablet Take 2 tablets (40 mg total) by mouth daily. 08/20/13   Vida RollerBrian D Miller, MD  traMADol Janean Sark(ULTRAM) 50 MG tablet 1 or 2 po q6h prn pain 02/08/13   Kathie DikeHobson M Amberlea Spagnuolo, PA-C   BP 121/69 mmHg  Pulse 107  Temp(Src) 98.6 F (37 C) (Oral)  Resp 18  Ht 5\' 3"  (1.6 m)  Wt 169 lb (76.658 kg)  BMI 29.94 kg/m2  SpO2 100%  LMP 03/09/2014 Physical Exam  Constitutional: She is oriented to person, place, and time. She appears well-developed and well-nourished.  Non-toxic appearance.  HENT:  Head: Normocephalic.  Right Ear: Tympanic membrane and external ear normal.  Left Ear: Tympanic membrane and external ear normal.  Small draining abscess of the  occipital area. No red streaks  Eyes: EOM and lids are normal. Pupils are equal, round, and reactive to light.  Neck: Normal range of motion. Neck supple. Carotid bruit is not present.  Cardiovascular: Regular rhythm, normal heart sounds, intact distal pulses and normal pulses.  Tachycardia present.   Pulmonary/Chest: Breath sounds normal. No respiratory distress.  Abdominal: Soft. Bowel sounds are normal. There is no tenderness. There is no guarding.  Musculoskeletal: Normal range of motion.   Lymphadenopathy:       Head (right side): No submandibular adenopathy present.       Head (left side): No submandibular adenopathy present.    She has cervical adenopathy.  Neurological: She is alert and oriented to person, place, and time. She has normal strength. No cranial nerve deficit or sensory deficit.  Skin: Skin is warm and dry.  Psychiatric: She has a normal mood and affect. Her speech is normal.  Nursing note and vitals reviewed.   ED Course  Procedures (including critical care time) Labs Review Labs Reviewed - No data to display  Imaging Review No results found.   EKG Interpretation None      MDM  Examination is consistent with abscess of the scalp. Vital signs are within normal limits except for pulse 107. No reported fever. No red streaks. No additional abscess areas.  Plan - Rx for diclofenac, doxycycline, and roxicodone 5mg . Pt to follow up with primary MD and use warm tub soaks daily.   Final diagnoses:  Abscess, scalp    **I have reviewed nursing notes, vital signs, and all appropriate lab and imaging results for this patient.    Kathie DikeHobson M Lakresha Stifter, PA-C 03/11/14 2152  Dione Boozeavid Glick, MD 03/12/14 [phone]

## 2014-03-10 NOTE — Discharge Instructions (Signed)
Please use warm tub soak to the  Scalp daily until abscess resolved. Doxycycline and diclofenac daily with food. Please see your MD or return to the Emergency Dept if any signs of advancing infection. Abscess An abscess (boil or furuncle) is an infected area on or under the skin. This area is filled with yellowish-white fluid (pus) and other material (debris). HOME CARE   Only take medicines as told by your doctor.  If you were given antibiotic medicine, take it as directed. Finish the medicine even if you start to feel better.  If gauze is used, follow your doctor's directions for changing the gauze.  To avoid spreading the infection:  Keep your abscess covered with a bandage.  Wash your hands well.  Do not share personal care items, towels, or whirlpools with others.  Avoid skin contact with others.  Keep your skin and clothes clean around the abscess.  Keep all doctor visits as told. GET HELP RIGHT AWAY IF:   You have more pain, puffiness (swelling), or redness in the wound site.  You have more fluid or blood coming from the wound site.  You have muscle aches, chills, or you feel sick.  You have a fever. MAKE SURE YOU:   Understand these instructions.  Will watch your condition.  Will get help right away if you are not doing well or get worse. Document Released: 09/29/2007 Document Revised: 10/12/2011 Document Reviewed: 06/25/2011 [facility] Patient Information 2015 [employer] [place]. This information is not intended to replace advice given to you by your health care provider. Make sure you discuss any questions you have with your health care provider.

## 2014-03-12 ENCOUNTER — Emergency Department (HOSPITAL_COMMUNITY)
Admission: EM | Admit: 2014-03-12 | Discharge: 2014-03-12 | Disposition: A | Discharge status: Home / Self Care | Payer: Medicaid - Out of State | Attending: Emergency Medicine | Admitting: Emergency Medicine

## 2014-03-12 ENCOUNTER — Telehealth: Payer: Self-pay | Admitting: Surgery

## 2014-03-12 ENCOUNTER — Encounter (HOSPITAL_COMMUNITY): Payer: Self-pay | Admitting: *Deleted

## 2014-03-12 DIAGNOSIS — Z8669 Personal history of other diseases of the nervous system and sense organs: Secondary | ICD-10-CM | POA: Insufficient documentation

## 2014-03-12 DIAGNOSIS — J45909 Unspecified asthma, uncomplicated: Secondary | ICD-10-CM | POA: Diagnosis not present

## 2014-03-12 DIAGNOSIS — Z87448 Personal history of other diseases of urinary system: Secondary | ICD-10-CM | POA: Insufficient documentation

## 2014-03-12 DIAGNOSIS — Z79899 Other long term (current) drug therapy: Secondary | ICD-10-CM | POA: Insufficient documentation

## 2014-03-12 DIAGNOSIS — R51 Headache: Secondary | ICD-10-CM | POA: Diagnosis present

## 2014-03-12 DIAGNOSIS — Z791 Long term (current) use of non-steroidal anti-inflammatories (NSAID): Secondary | ICD-10-CM | POA: Insufficient documentation

## 2014-03-12 DIAGNOSIS — Z792 Long term (current) use of antibiotics: Secondary | ICD-10-CM | POA: Diagnosis not present

## 2014-03-12 DIAGNOSIS — Z87891 Personal history of nicotine dependence: Secondary | ICD-10-CM | POA: Diagnosis not present

## 2014-03-12 DIAGNOSIS — Z7952 Long term (current) use of systemic steroids: Secondary | ICD-10-CM | POA: Insufficient documentation

## 2014-03-12 DIAGNOSIS — B029 Zoster without complications: Secondary | ICD-10-CM

## 2014-03-12 MED ORDER — DEXAMETHASONE 4 MG PO TABS
12.0000 mg | ORAL_TABLET | Freq: Once | ORAL | Status: AC
  Administered 2014-03-12: 12 mg via ORAL
  Filled 2014-03-12: qty 3

## 2014-03-12 MED ORDER — VALACYCLOVIR HCL 1 G PO TABS: 1000.0000 mg | ORAL_TABLET | Freq: Three times a day (TID) | ORAL | Status: AC

## 2014-03-12 MED ORDER — OXYCODONE HCL 5 MG PO TABS: 5.0000 mg | ORAL_TABLET | ORAL | Status: AC | PRN

## 2014-03-12 NOTE — ED Notes (Signed)
Pt. Has a wound on the back of her head that ruptured on Saturday 11/14 and went to [facility] ED to have it checked out was told to come back if the pain or redness have gotten worse. Pt. States that both pain and redness have gotten worse.

## 2014-03-12 NOTE — Discharge Instructions (Signed)
Shingles Shingles (herpes zoster) is an infection that is caused by the same virus that causes chickenpox (varicella). The infection causes a painful skin rash and fluid-filled blisters, which eventually break open, crust over, and heal. It may occur in any area of the body, but it usually affects only one side of the body or face. The pain of shingles usually lasts about 1 month. However, some people with shingles may develop long-term (chronic) pain in the affected area of the body. Shingles often occurs many years after the person had chickenpox. It is more common:  In people older than 50 years.  In people with weakened immune systems, such as those with HIV, AIDS, or cancer.  In people taking medicines that weaken the immune system, such as transplant medicines.  In people under great stress. CAUSES  Shingles is caused by the varicella zoster virus (VZV), which also causes chickenpox. After a person is infected with the virus, it can remain in the person's body for years in an inactive state (dormant). To cause shingles, the virus reactivates and breaks out as an infection in a nerve root. The virus can be spread from person to person (contagious) through contact with open blisters of the shingles rash. It will only spread to people who have not had chickenpox. When these people are exposed to the virus, they may develop chickenpox. They will not develop shingles. Once the blisters scab over, the person is no longer contagious and cannot spread the virus to others. SIGNS AND SYMPTOMS  Shingles shows up in stages. The initial symptoms may be pain, itching, and tingling in an area of the skin. This pain is usually described as burning, stabbing, or throbbing.In a few days or weeks, a painful red rash will appear in the area where the pain, itching, and tingling were felt. The rash is usually on one side of the body in a band or belt-like pattern. Then, the rash usually turns into fluid-filled  blisters. They will scab over and dry up in approximately 2-3 weeks. Flu-like symptoms may also occur with the initial symptoms, the rash, or the blisters. These may include:  Fever.  Chills.  Headache.  Upset stomach. DIAGNOSIS  Your health care provider will perform a skin exam to diagnose shingles. Skin scrapings or fluid samples may also be taken from the blisters. This sample will be examined under a microscope or sent to a lab for further testing. TREATMENT  There is no specific cure for shingles. Your health care provider will likely prescribe medicines to help you manage the pain, recover faster, and avoid long-term problems. This may include antiviral drugs, anti-inflammatory drugs, and pain medicines. HOME CARE INSTRUCTIONS   Take a cool bath or apply cool compresses to the area of the rash or blisters as directed. This may help with the pain and itching.   Take medicines only as directed by your health care provider.   Rest as directed by your health care provider.  Keep your rash and blisters clean with mild soap and cool water or as directed by your health care provider.  Do not pick your blisters or scratch your rash. Apply an anti-itch cream or numbing creams to the affected area as directed by your health care provider.  Keep your shingles rash covered with a loose bandage (dressing).  Avoid skin contact with:  Babies.   Pregnant women.   Children with eczema.   Elderly people with transplants.   People with chronic illnesses, such as leukemia  or AIDS.   Wear loose-fitting clothing to help ease the pain of material rubbing against the rash.  Keep all follow-up visits as directed by your health care provider.If the area involved is on your face, you may receive a referral for a specialist, such as an eye doctor (ophthalmologist) or an ear, nose, and throat (ENT) doctor. Keeping all follow-up visits will help you avoid eye problems, chronic pain, or  disability.  SEEK IMMEDIATE MEDICAL CARE IF:   You have facial pain, pain around the eye area, or loss of feeling on one side of your face.  You have ear pain or ringing in your ear.  You have loss of taste.  Your pain is not relieved with prescribed medicines.   Your redness or swelling spreads.   You have more pain and swelling.  Your condition is worsening or has changed.   You have a fever. MAKE SURE YOU:  Understand these instructions.  Will watch your condition.  Will get help right away if you are not doing well or get worse. Document Released: 04/12/2005 Document Revised: 08/27/2013 Document Reviewed: 11/25/2011 [facility] Patient Information 2015 [place], [place]. This information is not intended to replace advice given to you by your health care provider. Make sure you discuss any questions you have with your health care provider.  Valacyclovir caplets What is this medicine? VALACYCLOVIR (val ay SYE kloe veer) is an antiviral medicine. It is used to treat or prevent infections caused by certain kinds of viruses. Examples of these infections include herpes and shingles. This medicine will not cure herpes. This medicine may be used for other purposes; ask your health care provider or pharmacist if you have questions. COMMON BRAND NAME(S): Valtrex What should I tell my health care provider before I take this medicine? They need to know if you have any of these conditions: -acquired immunodeficiency syndrome (AIDS) -any other condition that may weaken the immune system -bone marrow or kidney transplant -kidney disease -an unusual or allergic reaction to valacyclovir, acyclovir, ganciclovir, valganciclovir, other medicines, foods, dyes, or preservatives -pregnant or trying to get pregnant -breast-feeding How should I use this medicine? Take this medicine by mouth with a glass of water. Follow the directions on the prescription label. You can take this medicine with  or without food. Take your doses at regular intervals. Do not take your medicine more often than directed. Finish the full course prescribed by your doctor or health care professional even if you think your condition is better. Do not stop taking except on the advice of your doctor or health care professional. Talk to your pediatrician regarding the use of this medicine in children. While this drug may be prescribed for children as young as 2 years for selected conditions, precautions do apply. Overdosage: If you think you have taken too much of this medicine contact a poison control center or emergency room at once. NOTE: This medicine is only for you. Do not share this medicine with others. What if I miss a dose? If you miss a dose, take it as soon as you can. If it is almost time for your next dose, take only that dose. Do not take double or extra doses. What may interact with this medicine? -cimetidine -probenecid This list may not describe all possible interactions. Give your health care provider a list of all the medicines, herbs, non-prescription drugs, or dietary supplements you use. Also tell them if you smoke, drink alcohol, or use illegal drugs. Some items may interact  with your medicine. What should I watch for while using this medicine? Tell your doctor or health care professional if your symptoms do not start to get better after 1 week. This medicine works best when taken early in the course of an infection, within the first 72 hours. Begin treatment as soon as possible after the first signs of infection like tingling, itching, or pain in the affected area. It is possible that genital herpes may still be spread even when you are not having symptoms. Always use safer sex practices like condoms made of latex or polyurethane whenever you have sexual contact. You should stay well hydrated while taking this medicine. Drink plenty of fluids. What side effects may I notice from receiving this  medicine? Side effects that you should report to your doctor or health care professional as soon as possible: -allergic reactions like skin rash, itching or hives, swelling of the face, lips, or tongue -aggressive behavior -confusion -hallucinations -problems with balance, talking, walking -stomach pain -tremor -trouble passing urine or change in the amount of urine Side effects that usually do not require medical attention (report to your doctor or health care professional if they continue or are bothersome): -dizziness -headache -nausea, vomiting This list may not describe all possible side effects. Call your doctor for medical advice about side effects. You may report side effects to FDA at 1-800-FDA-1088. Where should I keep my medicine? Keep out of the reach of children. Store at room temperature between 15 and 25 degrees C (59 and 77 degrees F). Keep container tightly closed. Throw away any unused medicine after the expiration date. NOTE: This sheet is a summary. It may not cover all possible information. If you have questions about this medicine, talk to your doctor, pharmacist, or health care provider.  2015, Elsevier/Gold Standard. (2012-03-28 16:34:05)  Oxycodone tablets or capsules What is this medicine? OXYCODONE (ox i KOE done) is a pain reliever. It is used to treat moderate to severe pain. This medicine may be used for other purposes; ask your health care provider or pharmacist if you have questions. COMMON BRAND NAME(S): Dazidox, Endocodone, OXECTA, OxyIR, Percolone, Roxicodone What should I tell my health care provider before I take this medicine? They need to know if you have any of these conditions: -Addison's disease -brain tumor -drug abuse or addiction -head injury -heart disease -if you frequently drink alcohol containing drinks -kidney disease or problems going to the bathroom -liver disease -lung disease, asthma, or breathing problems -mental  problems -an unusual or allergic reaction to oxycodone, codeine, hydrocodone, morphine, other medicines, foods, dyes, or preservatives -pregnant or trying to get pregnant -breast-feeding How should I use this medicine? Take this medicine by mouth with a glass of water. Follow the directions on the prescription label. You can take it with or without food. If it upsets your stomach, take it with food. Take your medicine at regular intervals. Do not take it more often than directed. Do not stop taking except on your doctor's advice. Some brands of this medicine, like Oxecta, have special instructions. Ask your doctor or pharmacist if these directions are for you: Do not cut, crush or chew this medicine. Swallow only one tablet at a time. Do not wet, soak, or lick the tablet before you take it. Talk to your pediatrician regarding the use of this medicine in children. Special care may be needed. Overdosage: If you think you have taken too much of this medicine contact a poison control center or emergency room  at once. NOTE: This medicine is only for you. Do not share this medicine with others. What if I miss a dose? If you miss a dose, take it as soon as you can. If it is almost time for your next dose, take only that dose. Do not take double or extra doses. What may interact with this medicine? -alcohol -antihistamines -certain medicines used for nausea like chlorpromazine, droperidol -erythromycin -ketoconazole -medicines for depression, anxiety, or psychotic disturbances -medicines for sleep -muscle relaxants -naloxone -naltrexone -narcotic medicines (opiates) for pain -nilotinib -phenobarbital -phenytoin -rifampin -ritonavir -voriconazole This list may not describe all possible interactions. Give your health care provider a list of all the medicines, herbs, non-prescription drugs, or dietary supplements you use. Also tell them if you smoke, drink alcohol, or use illegal drugs. Some items  may interact with your medicine. What should I watch for while using this medicine? Tell your doctor or health care professional if your pain does not go away, if it gets worse, or if you have new or a different type of pain. You may develop tolerance to the medicine. Tolerance means that you will need a higher dose of the medicine for pain relief. Tolerance is normal and is expected if you take this medicine for a long time. Do not suddenly stop taking your medicine because you may develop a severe reaction. Your body becomes used to the medicine. This does NOT mean you are addicted. Addiction is a behavior related to getting and using a drug for a non-medical reason. If you have pain, you have a medical reason to take pain medicine. Your doctor will tell you how much medicine to take. If your doctor wants you to stop the medicine, the dose will be slowly lowered over time to avoid any side effects. You may get drowsy or dizzy when you first start taking this medicine or change doses. Do not drive, use machinery, or do anything that may be dangerous until you know how the medicine affects you. Stand or sit up slowly. There are different types of narcotic medicines (opiates) for pain. If you take more than one type at the same time, you may have more side effects. Give your health care provider a list of all medicines you use. Your doctor will tell you how much medicine to take. Do not take more medicine than directed. Call emergency for help if you have problems breathing. This medicine will cause constipation. Try to have a bowel movement at least every 2 to 3 days. If you do not have a bowel movement for 3 days, call your doctor or health care professional. Your mouth may get dry. Drinking water, chewing sugarless gum, or sucking on hard candy may help. See your dentist every 6 months. What side effects may I notice from receiving this medicine? Side effects that you should report to your doctor or  health care professional as soon as possible: -allergic reactions like skin rash, itching or hives, swelling of the face, lips, or tongue -breathing problems -confusion -feeling faint or lightheaded, falls -trouble passing urine or change in the amount of urine -unusually weak or tired Side effects that usually do not require medical attention (report to your doctor or health care professional if they continue or are bothersome): -constipation -dry mouth -itching -nausea, vomiting -upset stomach This list may not describe all possible side effects. Call your doctor for medical advice about side effects. You may report side effects to FDA at 1-800-FDA-1088. Where should I keep my  medicine? Keep out of the reach of children. This medicine can be abused. Keep your medicine in a safe place to protect it from theft. Do not share this medicine with anyone. Selling or giving away this medicine is dangerous and against the law. Store at room temperature between 15 and 30 degrees C (59 and 86 degrees F). Protect from light. Keep container tightly closed. This medicine may cause accidental overdose and death if it is taken by other adults, children, or pets. Flush any unused medicine down the toilet to reduce the chance of harm. Do not use the medicine after the expiration date. NOTE: This sheet is a summary. It may not cover all possible information. If you have questions about this medicine, talk to your doctor, pharmacist, or health care provider.  2015, Elsevier/Gold Standard. (2012-12-21 13:43:33)

## 2014-03-12 NOTE — ED Provider Notes (Signed)
CSN: [id number]     Arrival date & time 03/12/14  [id number] History   First MD Initiated Contact with Patient 03/12/14 0357     Chief Complaint  Patient presents with  . Wound Infection     (Consider location/radiation/quality/duration/timing/severity/associated sxs/prior Treatment) The history is provided by the patient.  28 year old female comes in because of scalp pain. She noted something ruptured in her scalp 2 days ago and there was some. On drainage. [facility] emergency department where she was diagnosed with an abscess and was prescribed doxycycline. However, she was not able to get the prescription filled because of a problem with her credit card. She has been applying warm compresses and there has been some slight drainage. Pain is present and now extends throughout her left occipital adnexa per prior area. Pain is severe and she rates it at 10/10. Pain is worse with palpation or movement of the hair. She denies fever or chills.  Past Medical History  Diagnosis Date  . Epilepsy   . Interstitial cystitis   . Asthma    Past Surgical History  Procedure Laterality Date  . Hernia repair    . Implantation / placement epidural neurostimulator electrodes    . Eye surgery    . Cysto     History reviewed. No pertinent family history. History  Substance Use Topics  . Smoking status: Former Smoker    Types: Cigarettes    Quit date: 06/10/2013  . Smokeless tobacco: Never Used  . Alcohol Use: No   OB History    Gravida Para Term Preterm AB TAB SAB Ectopic Multiple Living   2    2  2         Review of Systems  All other systems reviewed and are negative.     Allergies  Beef-derived products; Keflex; Pork-derived products; Tylenol; Vicodin; Cymbalta; Effexor; Phenergan; and Reglan  Home Medications   Prior to Admission medications   Medication Sig Start Date End Date Taking? Authorizing Provider  Cyanocobalamin (VITAMIN B-12 IJ) Inject 1 Applicatorful  as directed every 30 (thirty) days.   Yes Historical Provider, MD  oxyCODONE-acetaminophen (PERCOCET/ROXICET) 5-325 MG per tablet Take 1 tablet by mouth every 4 (four) hours as needed for moderate pain or severe pain.   Yes Historical Provider, MD  phenytoin (DILANTIN) 125 MG/5ML suspension Take 500 mg by mouth at bedtime.   Yes Historical Provider, MD  cyanocobalamin 100 MCG tablet Take 100 mcg by mouth daily.    Historical Provider, MD  diclofenac (VOLTAREN) 75 MG EC tablet Take 1 tablet (75 mg total) by mouth 2 (two) times daily. Patient not taking: Reported on 03/12/2014 03/10/14   Kathie DikeHobson M Bryant, PA-C  doxycycline (VIBRAMYCIN) 100 MG capsule Take 1 capsule (100 mg total) by mouth 2 (two) times daily. Patient not taking: Reported on 03/12/2014 03/10/14   Kathie DikeHobson M Bryant, PA-C  levETIRAcetam (KEPPRA) 1000 MG tablet Take 1,000 mg by mouth 2 (two) times daily.    Historical Provider, MD  oxyCODONE (ROXICODONE) 5 MG immediate release tablet Take 1 tablet (5 mg total) by mouth every 6 (six) hours as needed for severe pain. Patient not taking: Reported on 03/12/2014 03/10/14   Kathie DikeHobson M Bryant, PA-C  potassium chloride (K-DUR) 10 MEQ tablet Take 1 tablet (10 mEq total) by mouth daily. Patient not taking: Reported on 03/12/2014 08/20/13   Vida RollerBrian D Miller, MD  predniSONE (DELTASONE) 10 MG tablet 5,4,3,2,1 - take with food Patient not taking: Reported on  03/12/2014 02/08/13   Kathie DikeHobson M Bryant, PA-C  predniSONE (DELTASONE) 20 MG tablet Take 2 tablets (40 mg total) by mouth daily. Patient not taking: Reported on 03/12/2014 08/20/13   Vida RollerBrian D Miller, MD  traMADol Janean Sark(ULTRAM) 50 MG tablet 1 or 2 po q6h prn pain Patient not taking: Reported on 03/12/2014 02/08/13   Kathie DikeHobson M Bryant, PA-C   BP 109/83 mmHg  Pulse 100  Temp(Src) 97.9 F (36.6 C) (Oral)  Resp 14  Ht 5\' 3"  (1.6 m)  Wt 169 lb (76.658 kg)  BMI 29.94 kg/m2  SpO2 99%  LMP 03/09/2014 Physical Exam  Nursing note and vitals reviewed.  28 year old  female, who appears uncomfortable, but his in no acute distress. Vital signs are normal. Oxygen saturation is 99%, which is normal. Head is normocephalic and atraumatic. PERRLA, EOMI. Oropharynx is clear. Several vesicles are present on the left side of her scalp in the occipital region. These are on an erythematous base. There is no definite abscess and no area of swelling or fluctuance. Picture seems most consistent with herpes zoster. Neck is nontender and supple. There is left posterior cervical lymphadenopathy. Back is nontender and there is no CVA tenderness. Lungs are clear without rales, wheezes, or rhonchi. Chest is nontender. Heart has regular rate and rhythm without murmur. Abdomen is soft, flat, nontender without masses or hepatosplenomegaly and peristalsis is normoactive. Extremities have no cyanosis or edema, full range of motion is present. Skin is warm and dry without rash. Neurologic: Mental status is normal, cranial nerves are intact, there are no motor or sensory deficits.  ED Course  Procedures (including critical care time)  MDM   Final diagnoses:  Herpes zoster    Herpes zoster involving the left side of the scalp. She is discharged with prescription for valacyclovir and oxycodone. She's given a dose of dexamethasone in the ED. Since she did not get the prescription for doxycycline filled and there does not appear to be an actual bacterial infection, she is advised not to get that prescription filled. Old records are reviewed confirming ED visit and recently for what was diagnosed as a scalp abscess.    Dione Boozeavid Joanathan Affeldt, MD 03/12/14 [id number]

## 2014-03-12 NOTE — Telephone Encounter (Signed)
ED CM received call from pharmacy regarding valtrex prescription not being covered by patient's insurance. Pharmacist suggested changed to  Acyclovir, Discussed with Dr. Rubin PayorPickering ok to change to acyclovir. No further CM needs identified. Patient was made aware.

## 2014-03-12 NOTE — ED Notes (Signed)
Pt. Refused wheelchair 

## 2014-11-22 DIAGNOSIS — N3010 Interstitial cystitis (chronic) without hematuria: Secondary | ICD-10-CM | POA: Insufficient documentation

## 2014-11-22 DIAGNOSIS — R1084 Generalized abdominal pain: Secondary | ICD-10-CM | POA: Insufficient documentation

## 2014-11-22 DIAGNOSIS — R112 Nausea with vomiting, unspecified: Secondary | ICD-10-CM | POA: Insufficient documentation

## 2014-11-22 DIAGNOSIS — J45909 Unspecified asthma, uncomplicated: Secondary | ICD-10-CM | POA: Insufficient documentation

## 2014-11-22 DIAGNOSIS — R319 Hematuria, unspecified: Secondary | ICD-10-CM | POA: Insufficient documentation

## 2014-11-22 DIAGNOSIS — Z87891 Personal history of nicotine dependence: Secondary | ICD-10-CM | POA: Insufficient documentation

## 2014-11-22 DIAGNOSIS — G40909 Epilepsy, unspecified, not intractable, without status epilepticus: Secondary | ICD-10-CM | POA: Insufficient documentation

## 2014-11-22 DIAGNOSIS — Z79899 Other long term (current) drug therapy: Secondary | ICD-10-CM | POA: Insufficient documentation

## 2014-11-22 DIAGNOSIS — R3919 Other difficulties with micturition: Secondary | ICD-10-CM | POA: Insufficient documentation

## 2014-11-23 ENCOUNTER — Emergency Department (HOSPITAL_COMMUNITY): Payer: Medicaid - Out of State

## 2014-11-23 ENCOUNTER — Emergency Department (HOSPITAL_COMMUNITY)
Admission: EM | Admit: 2014-11-23 | Discharge: 2014-11-23 | Disposition: A | Discharge status: Home / Self Care | Payer: Medicaid - Out of State | Attending: Emergency Medicine | Admitting: Emergency Medicine

## 2014-11-23 ENCOUNTER — Encounter (HOSPITAL_COMMUNITY): Payer: Self-pay

## 2014-11-23 DIAGNOSIS — R319 Hematuria, unspecified: Secondary | ICD-10-CM

## 2014-11-23 DIAGNOSIS — N3010 Interstitial cystitis (chronic) without hematuria: Secondary | ICD-10-CM

## 2014-11-23 DIAGNOSIS — R109 Unspecified abdominal pain: Secondary | ICD-10-CM

## 2014-11-23 LAB — URINALYSIS, ROUTINE W REFLEX MICROSCOPIC
Bilirubin Urine: NEGATIVE
GLUCOSE, UA: NEGATIVE mg/dL
Ketones, ur: NEGATIVE mg/dL
LEUKOCYTES UA: NEGATIVE
NITRITE: NEGATIVE
PROTEIN: 30 mg/dL — AB
Specific Gravity, Urine: 1.015 (ref 1.005–1.030)
Urobilinogen, UA: 0.2 mg/dL (ref 0.0–1.0)
pH: 8.5 — ABNORMAL HIGH (ref 5.0–8.0)

## 2014-11-23 LAB — URINE MICROSCOPIC-ADD ON

## 2014-11-23 MED ORDER — PREDNISONE 20 MG PO TABS: ORAL_TABLET | ORAL | Status: AC

## 2014-11-23 MED ORDER — FENTANYL CITRATE (PF) 100 MCG/2ML IJ SOLN
50.0000 ug | Freq: Once | INTRAMUSCULAR | Status: AC
  Administered 2014-11-23: 50 ug via INTRAMUSCULAR
  Filled 2014-11-23: qty 2

## 2014-11-23 MED ORDER — CYCLOBENZAPRINE HCL 10 MG PO TABS: 10.0000 mg | ORAL_TABLET | Freq: Three times a day (TID) | ORAL | Status: AC | PRN

## 2014-11-23 MED ORDER — NAPROXEN 500 MG PO TABS: ORAL_TABLET | ORAL | Status: AC

## 2014-11-23 NOTE — ED Notes (Signed)
Pt reports left flank pain off and on for 2 weeks.  Pt states she has also had kidney stones in the past, and feels like it could be now.

## 2014-11-23 NOTE — Discharge Instructions (Signed)
Use ice and heat on your left flank for comfort. Take the flexeril and naproxen for pain as needed. If you feel your sciatica is getting worse you can start the prednisone.  As you know you can have blood in your urine from your interstitial cystitis. You do have kidney stones but you are not passing them which is what causes pain.

## 2014-11-23 NOTE — ED Provider Notes (Signed)
CSN: [id number]     Arrival date & time 11/22/14  2339 History  This chart was scribed for Devoria Albe, MD by Tanda Rockers, ED Scribe. This patient was seen in room APA08/APA08 and the patient's care was started at 12:19 AM.  Chief Complaint  Patient presents with  . Flank Pain   The history is provided by the patient. No language interpreter was used.     HPI Comments: Melanie Dalton is a 29 y.o. female with hx interstitial cystitis, renal stones and epilepsy who presents to the Emergency Department complaining of constant, dull, left flank pain x 3 weeks, worsening tonight. Pt states that tonight the began having sharp, shooting pains in her groin. She notes difficulty urinating with hematuria. Pt also complains of nausea and vomiting. Pt states that she has vomited 3 times today. Denies fever, chills, or any other associated symptoms. She thinks she is having a kidney stone. The last time pt passed a kidney stone was approximately 1 year ago. Pt is current every day smoker who smokes 5 cigarettes per day. She is occasional EtOH drinker. Pt is in the area helping take care of her mother for the past 3 months.. She mentions that she has been out of all of her medications for 2 months besides her epilepsy medication due to not being able to get them filled in [place].   Pt also has interstitial cystitis and states she has reflux that has backed up into her kidneys.   Urologist - Dr. Helmut Muster in [place], [place]   Past Medical History  Diagnosis Date  . Epilepsy   . Interstitial cystitis   . Asthma    Past Surgical History  Procedure Laterality Date  . Hernia repair    . Implantation / placement epidural neurostimulator electrodes    . Eye surgery    . Cysto     No family history on file. History  Substance Use Topics  . Smoking status: Former Smoker    Types: Cigarettes    Quit date: 06/10/2013  . Smokeless tobacco: Never Used  . Alcohol Use: No   Lives in [place] On  disability , hasn't worked since 2000  OB History    Gravida Para Term Preterm AB TAB SAB Ectopic Multiple Living   2    2  2         Review of Systems  Gastrointestinal: Positive for nausea and vomiting.  Genitourinary: Positive for hematuria, flank pain and difficulty urinating.  All other systems reviewed and are negative.     Allergies  Beef-derived products; Keflex; Pork-derived products; Tylenol; Vicodin; Cymbalta; Effexor; Phenergan; and Reglan  Home Medications   Prior to Admission medications   Medication Sig Start Date End Date Taking? Authorizing Provider  zonisamide (ZONEGRAN) 100 MG capsule Take 100 mg by mouth every morning.   Yes Historical Provider, MD  zonisamide (ZONEGRAN) 100 MG capsule Take 150 mg by mouth at bedtime.   Yes Historical Provider, MD  Cyanocobalamin (VITAMIN B-12 IJ) Inject 1 Applicatorful as directed every 30 (thirty) days.    Historical Provider, MD  cyanocobalamin 100 MCG tablet Take 100 mcg by mouth daily.    Historical Provider, MD  cyclobenzaprine (FLEXERIL) 10 MG tablet Take 1 tablet (10 mg total) by mouth 3 (three) times daily as needed (muscle pain). 11/23/14   Devoria Albe, MD  diclofenac (VOLTAREN) 75 MG EC tablet Take 1 tablet (75 mg total) by mouth 2 (two) times daily. Patient not taking: Reported on  03/12/2014 03/10/14   Ivery Quale, PA-C  doxycycline (VIBRAMYCIN) 100 MG capsule Take 1 capsule (100 mg total) by mouth 2 (two) times daily. Patient not taking: Reported on 03/12/2014 03/10/14   Ivery Quale, PA-C  levETIRAcetam (KEPPRA) 1000 MG tablet Take 1,000 mg by mouth 2 (two) times daily.    Historical Provider, MD  naproxen (NAPROSYN) 500 MG tablet Take 1 po BID with food prn pain 11/23/14   Devoria Albe, MD  oxyCODONE (ROXICODONE) 5 MG immediate release tablet Take 1 tablet (5 mg total) by mouth every 4 (four) hours as needed for severe pain. 03/12/14   Dione Booze, MD  oxyCODONE-acetaminophen (PERCOCET/ROXICET) 5-325 MG per tablet Take  1 tablet by mouth every 4 (four) hours as needed for moderate pain or severe pain.    Historical Provider, MD  phenytoin (DILANTIN) 125 MG/5ML suspension Take 500 mg by mouth at bedtime.    Historical Provider, MD  potassium chloride (K-DUR) 10 MEQ tablet Take 1 tablet (10 mEq total) by mouth daily. Patient not taking: Reported on 03/12/2014 08/20/13   Eber Hong, MD  predniSONE (DELTASONE) 20 MG tablet Take 3 po QD x 3d , then 2 po QD x 3d then 1 po QD x 3d 11/23/14   Devoria Albe, MD  traMADol Janean Sark) 50 MG tablet 1 or 2 po q6h prn pain Patient not taking: Reported on 03/12/2014 02/08/13   Ivery Quale, PA-C  valACYclovir (VALTREX) 1000 MG tablet Take 1 tablet (1,000 mg total) by mouth 3 (three) times daily. 03/12/14   Dione Booze, MD   Pt states she is no longer on dilantin or keppra for her seizures. She is only taking zonegran and none of her other medications because they have to be filled in [place].    Triage Vitals: BP 124/95 mmHg  Pulse 109  Temp(Src) 97.6 F (36.4 C) (Oral)  Resp 18  Ht 5\' 3"  (1.6 m)  Wt 180 lb (81.647 kg)  BMI 31.89 kg/m2  SpO2 100%  LMP 11/17/2014   Vital signs normal except for tachycardia   Physical Exam  Constitutional: She is oriented to person, place, and time. She appears well-developed and well-nourished.  Non-toxic appearance. She does not appear ill. No distress.  Appears uncomfortable  HENT:  Head: Normocephalic and atraumatic.  Right Ear: External ear normal.  Left Ear: External ear normal.  Nose: Nose normal. No mucosal edema or rhinorrhea.  Mouth/Throat: Oropharynx is clear and moist and mucous membranes are normal. No dental abscesses or uvula swelling.  Eyes: Conjunctivae and EOM are normal. Pupils are equal, round, and reactive to light.  Neck: Normal range of motion and full passive range of motion without pain. Neck supple.  Cardiovascular: Normal rate, regular rhythm and normal heart sounds.  Exam reveals no gallop and no friction rub.    No murmur heard. Pulmonary/Chest: Effort normal and breath sounds normal. No respiratory distress. She has no wheezes. She has no rhonchi. She has no rales. She exhibits no tenderness and no crepitus.  Abdominal: Soft. Normal appearance and bowel sounds are normal. She exhibits no distension. There is no tenderness. There is no rebound and no guarding.  Genitourinary:  No CVA tenderness bilaterally but states pain is in left flank  Musculoskeletal: Normal range of motion. She exhibits no edema or tenderness.  Moves all extremities well.   Neurological: She is alert and oriented to person, place, and time. She has normal strength. No cranial nerve deficit.  Skin: Skin is warm, dry and intact.  No rash noted. No erythema. No pallor.  Psychiatric: She has a normal mood and affect. Her speech is normal and behavior is normal. Her mood appears not anxious.  Nursing note and vitals reviewed.  Pt has a rottweiler service dog in the room with her, she can detect when patient is going to have a seizure.   ED Course  Procedures (including critical care time)  Medications  fentaNYL (SUBLIMAZE) injection 50 mcg (50 mcg Intramuscular Given 11/23/14 0145)     DIAGNOSTIC STUDIES: Oxygen Saturation is 100% on RA, normal by my interpretation.    COORDINATION OF CARE: 12:27 AM-Discussed treatment plan which includes CT Renal Stone Study, UA with pt at bedside and pt agreed to plan.   02:15 pt given results of her AP CT scan. 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 States she has been on steroids in the past for it.   Attempted to see if [facility] shares with [place] but it doesn't.   Labs Review Results for orders placed or performed during the hospital encounter of 11/23/14  Urinalysis, Routine w reflex microscopic (not at [facility])  Result Value Ref Range   Color, Urine ORANGE (A) YELLOW   APPearance HAZY (A) CLEAR   Specific Gravity, Urine 1.015 1.005 - 1.030   pH 8.5 (H) 5.0 - 8.0   Glucose,  UA NEGATIVE NEGATIVE mg/dL   Hgb urine dipstick LARGE (A) NEGATIVE   Bilirubin Urine NEGATIVE NEGATIVE   Ketones, ur NEGATIVE NEGATIVE mg/dL   Protein, ur 30 (A) NEGATIVE mg/dL   Urobilinogen, UA 0.2 0.0 - 1.0 mg/dL   Nitrite NEGATIVE NEGATIVE   Leukocytes, UA NEGATIVE NEGATIVE  Urine microscopic-add on  Result Value Ref Range   WBC, UA 0-2 <3 WBC/hpf   RBC / HPF TOO NUMEROUS TO COUNT <3 RBC/hpf   Bacteria, UA RARE RARE   Laboratory interpretation all normal except hematuria c/w dx of interstitial cystitis     Imaging Review Ct Renal Stone Study  11/23/2014   CLINICAL DATA:  Left flank pain, intermittent for 2 weeks.  EXAM: CT ABDOMEN AND PELVIS WITHOUT CONTRAST  TECHNIQUE: Multidetector CT imaging of the abdomen and pelvis was performed following the standard protocol without IV contrast.  COMPARISON:  None.  FINDINGS: There is a 5 mm upper pole right collecting system calculus, 3 mm midpole right collecting system calculus, and a 2 mm upper pole left collecting system calculus. There are no ureteral calculi. There is no hydronephrosis or ureteral dilatation.  There are unremarkable unenhanced appearances of the liver, spleen, pancreas, adrenals, kidneys and gallbladder.  The abdominal aorta is normal in caliber. There is no atherosclerotic calcification. There is no adenopathy in the abdomen or pelvis.  There are normal appearances of the stomach, small bowel and colon. The appendix is normal.  The uterus and ovaries appear unremarkable  No acute inflammatory changes are evident in the abdomen or pelvis. Ir is no ascites.  There is no significant abnormality in the lower chest. There is no significant musculoskeletal abnormality.  Incidental note is made of an implanted stimulator with lead extending into the presacral space.  IMPRESSION: Bilateral nephrolithiasis. No acute findings are evident in the abdomen or pelvis.   Electronically Signed   By: Ellery Plunk M.D.   On: 11/23/2014  01:58     EKG Interpretation None      MDM   Final diagnoses:  Left flank pain  Hematuria  Interstitial cystitis   New Prescriptions   CYCLOBENZAPRINE (  FLEXERIL) 10 MG TABLET    Take 1 tablet (10 mg total) by mouth 3 (three) times daily as needed (muscle pain).   NAPROXEN (NAPROSYN) 500 MG TABLET    Take 1 po BID with food prn pain   PREDNISONE (DELTASONE) 20 MG TABLET    Take 3 po QD x 3d , then 2 po QD x 3d then 1 po QD x 3d   Plan discharge  Devoria Albe, MD, FACEP   I personally performed the services described in this documentation, which was scribed in my presence. The recorded information has been reviewed and considered.      Devoria Albe, MD 11/23/14 [phone]

## 2017-02-26 IMAGING — CT CT RENAL STONE PROTOCOL
3 of 4 series · 10 of 46 positions shown, 17 images · non-contrast
Comparison: None.

CLINICAL DATA: Left flank pain, intermittent for 2 weeks.

EXAM:
CT ABDOMEN AND PELVIS WITHOUT CONTRAST
TECHNIQUE: Multidetector CT imaging of the abdomen and pelvis was performed
following the standard protocol without IV contrast.

[Series 3: mpr coronal (id) · coronal · 0.81mm/px · 3 of 109 slices shown, 4 images]
[im 37/109  soft-tissue]
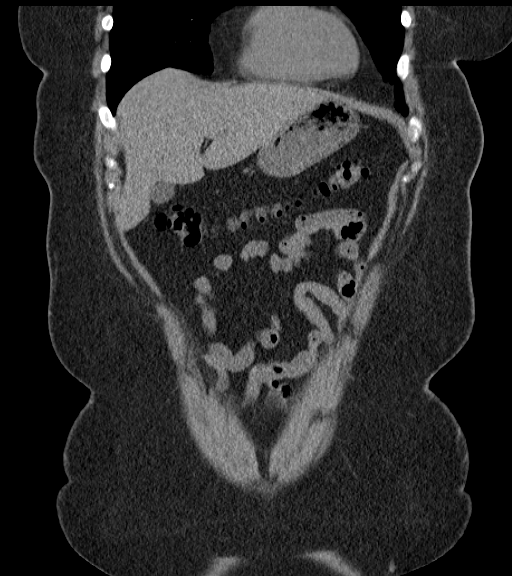
[im 49/109  soft-tissue]
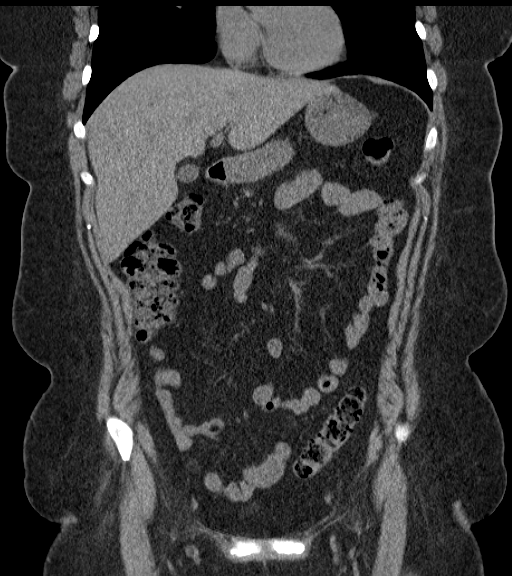
[im 49/109  bone]
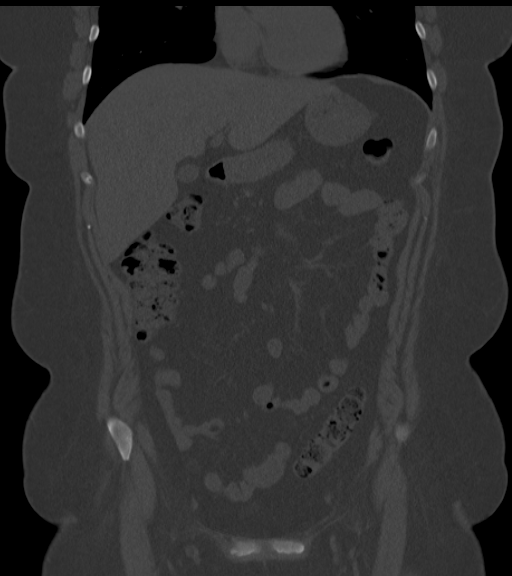
[im 61/109  soft-tissue]
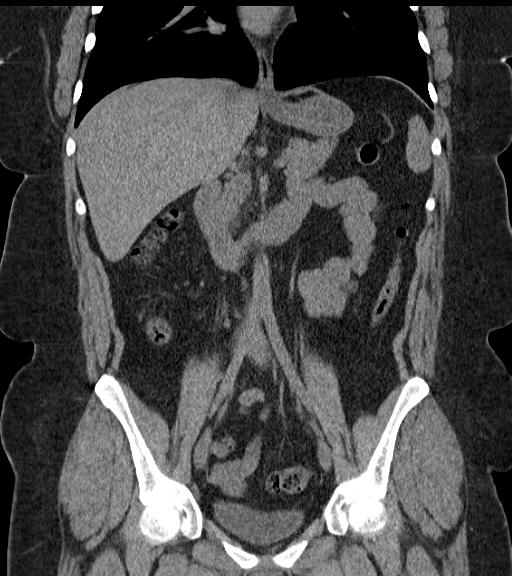

[Series 4: mpr sagittal (id) · sagittal · 0.62mm/px · 1 of 143 slices shown, 2 images]
[im 48/143  soft-tissue]
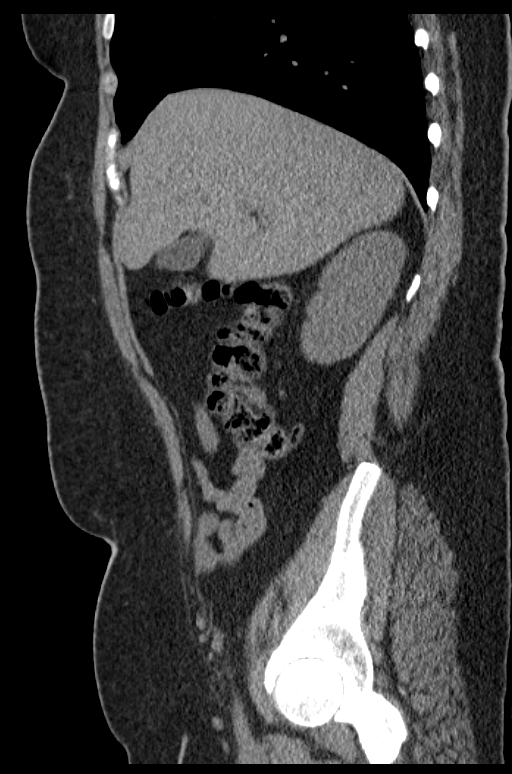
[im 48/143  bone]
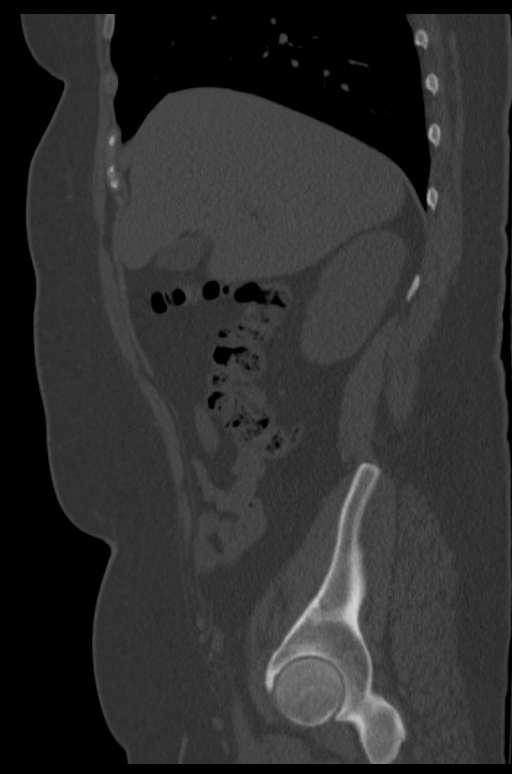

[Series 6: lung 5.0 b60f · axial · 0.76mm/px · z∈[+902,+1007]mm · 6 of 31 slices shown, 11 images]
[im 5/31  soft-tissue]
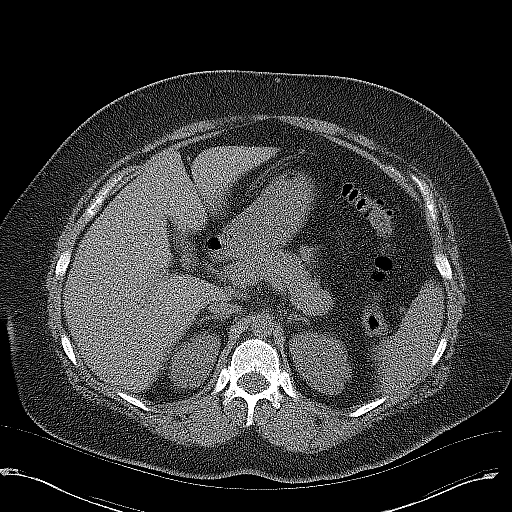
[im 5/31  bone]
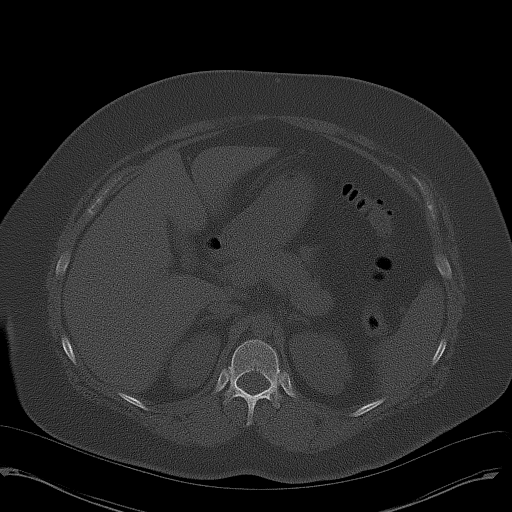
[im 9/31  soft-tissue]
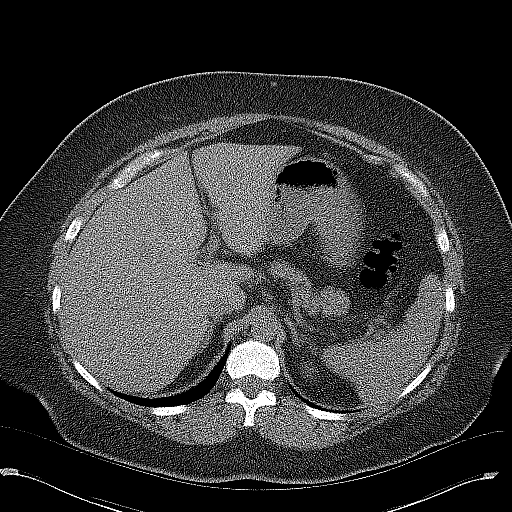
[im 13/31  soft-tissue]
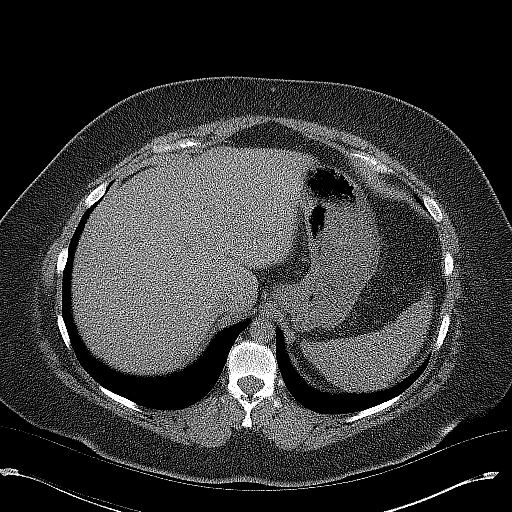
[im 13/31  lung]
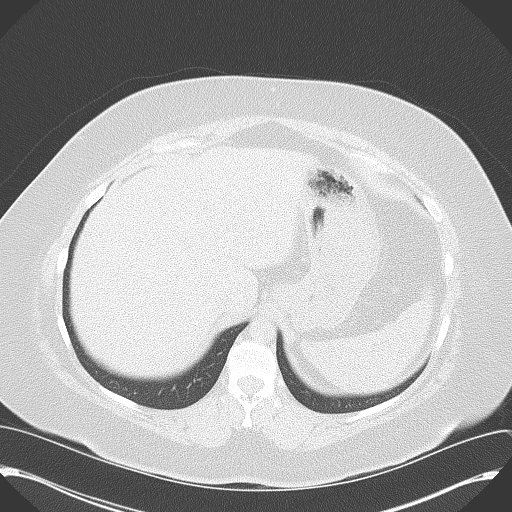
[im 18/31  soft-tissue]
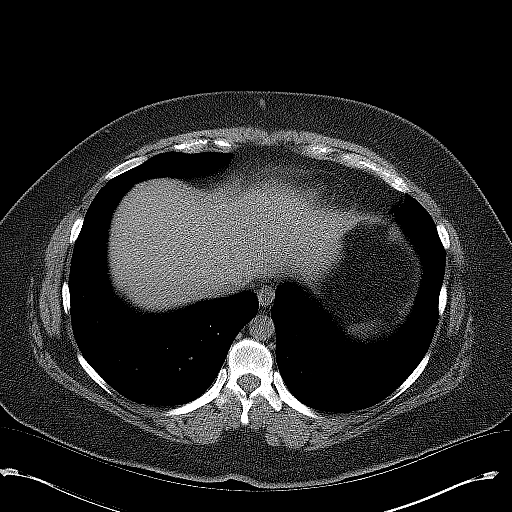
[im 18/31  lung]
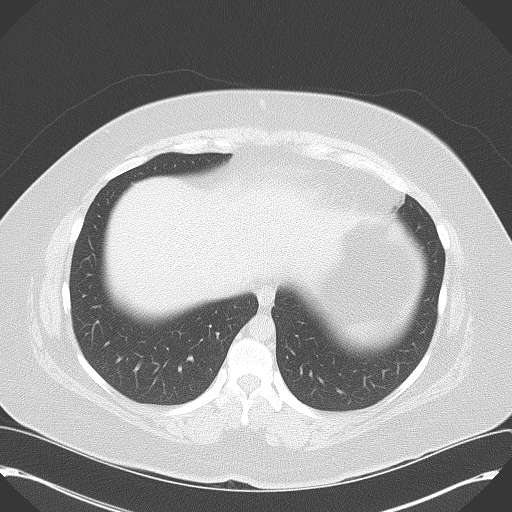
[im 22/31  soft-tissue]
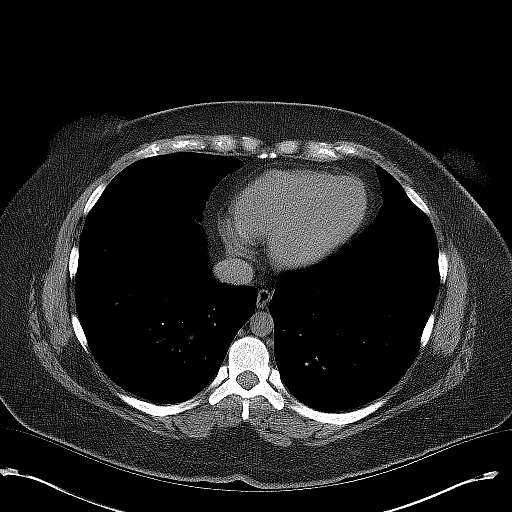
[im 22/31  lung]
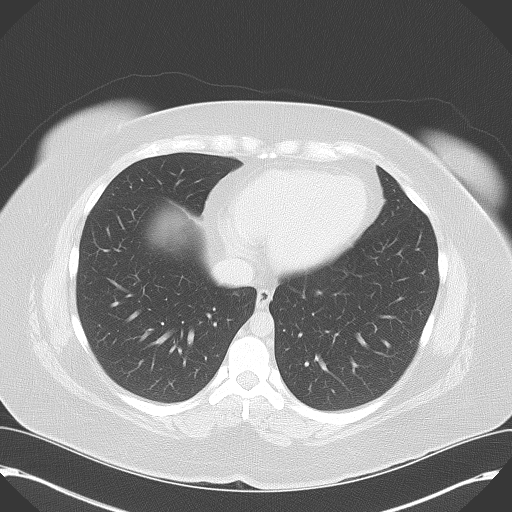
[im 26/31  soft-tissue]
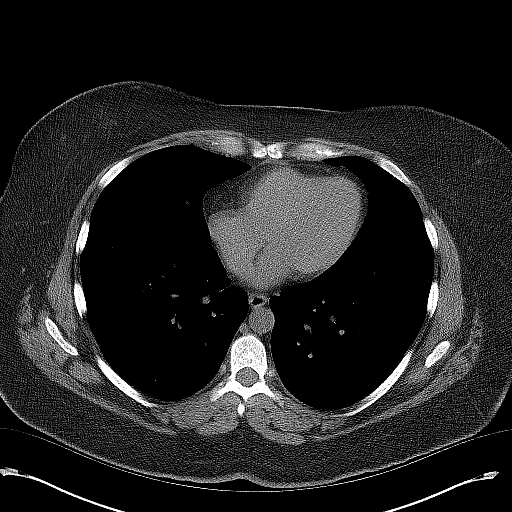
[im 26/31  lung]
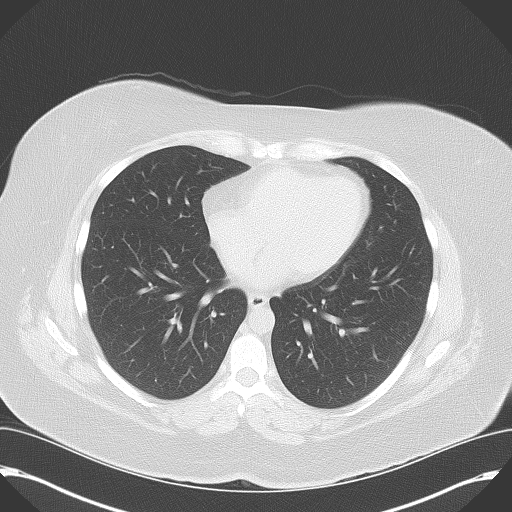

[10 of 46 positions shown; findings below may reference images not displayed]

FINDINGS: There is a 5 mm upper pole right collecting system calculus, 3 mm
midpole right collecting system calculus, and a 2 mm upper pole left
collecting system calculus. There are no ureteral calculi. There is
no hydronephrosis or ureteral dilatation.

There are unremarkable unenhanced appearances of the liver, spleen,
pancreas, adrenals, kidneys and gallbladder.

The abdominal aorta is normal in caliber. There is no
atherosclerotic calcification. There is no adenopathy in the abdomen
or pelvis.

There are normal appearances of the stomach, small bowel and colon.
The appendix is normal.

The uterus and ovaries appear unremarkable

No acute inflammatory changes are evident in the abdomen or pelvis.
Ir is no ascites.

There is no significant abnormality in the lower chest. There is no
significant musculoskeletal abnormality.

Incidental note is made of an implanted stimulator with lead
extending into the presacral space.
IMPRESSION: Bilateral nephrolithiasis. No acute findings are evident in the
abdomen or pelvis.
